# Patient Record
Sex: Male | Born: 1950
Health system: Southern US, Community
[De-identification: ages and names within clinical notes are randomized; demographics above are authoritative.]

## PROBLEM LIST (undated history)

## (undated) DIAGNOSIS — I10 Essential (primary) hypertension: Secondary | ICD-10-CM

## (undated) DIAGNOSIS — E119 Type 2 diabetes mellitus without complications: Secondary | ICD-10-CM

---

## 2019-02-27 ENCOUNTER — Inpatient Hospital Stay (HOSPITAL_COMMUNITY)
Admission: EM | Admit: 2019-02-27 | Discharge: 2019-03-03 | DRG: 177 | Disposition: A | Discharge status: Home / Self Care | Payer: Medicare PPO | Attending: Internal Medicine | Admitting: Internal Medicine

## 2019-02-27 ENCOUNTER — Telehealth: Payer: Self-pay | Admitting: Nurse Practitioner

## 2019-02-27 ENCOUNTER — Encounter (HOSPITAL_COMMUNITY): Payer: Self-pay | Admitting: Emergency Medicine

## 2019-02-27 ENCOUNTER — Emergency Department (HOSPITAL_COMMUNITY): Payer: Medicare PPO

## 2019-02-27 DIAGNOSIS — J1282 Pneumonia due to coronavirus disease 2019: Secondary | ICD-10-CM | POA: Diagnosis present

## 2019-02-27 DIAGNOSIS — U071 COVID-19: Secondary | ICD-10-CM | POA: Diagnosis present

## 2019-02-27 DIAGNOSIS — I10 Essential (primary) hypertension: Secondary | ICD-10-CM

## 2019-02-27 DIAGNOSIS — R0902 Hypoxemia: Secondary | ICD-10-CM

## 2019-02-27 DIAGNOSIS — Z79899 Other long term (current) drug therapy: Secondary | ICD-10-CM

## 2019-02-27 DIAGNOSIS — Z794 Long term (current) use of insulin: Secondary | ICD-10-CM | POA: Diagnosis not present

## 2019-02-27 DIAGNOSIS — J9601 Acute respiratory failure with hypoxia: Secondary | ICD-10-CM | POA: Diagnosis present

## 2019-02-27 DIAGNOSIS — I82452 Acute embolism and thrombosis of left peroneal vein: Secondary | ICD-10-CM | POA: Diagnosis present

## 2019-02-27 DIAGNOSIS — R791 Abnormal coagulation profile: Secondary | ICD-10-CM | POA: Diagnosis not present

## 2019-02-27 DIAGNOSIS — R7989 Other specified abnormal findings of blood chemistry: Secondary | ICD-10-CM | POA: Diagnosis present

## 2019-02-27 DIAGNOSIS — J189 Pneumonia, unspecified organism: Secondary | ICD-10-CM

## 2019-02-27 DIAGNOSIS — E119 Type 2 diabetes mellitus without complications: Secondary | ICD-10-CM | POA: Diagnosis present

## 2019-02-27 HISTORY — DX: Type 2 diabetes mellitus without complications: E11.9

## 2019-02-27 HISTORY — DX: Essential (primary) hypertension: I10

## 2019-02-27 LAB — CBC WITH DIFFERENTIAL/PLATELET
Abs Immature Granulocytes: 0.09 10*3/uL — ABNORMAL HIGH (ref 0.00–0.07)
Basophils Absolute: 0 10*3/uL (ref 0.0–0.1)
Basophils Relative: 0 %
Eosinophils Absolute: 0 10*3/uL (ref 0.0–0.5)
Eosinophils Relative: 0 %
HCT: 44.8 % (ref 39.0–52.0)
Hemoglobin: 14.7 g/dL (ref 13.0–17.0)
Immature Granulocytes: 1 %
Lymphocytes Relative: 15 %
Lymphs Abs: 1.3 10*3/uL (ref 0.7–4.0)
MCH: 30.8 pg (ref 26.0–34.0)
MCHC: 32.8 g/dL (ref 30.0–36.0)
MCV: 93.7 fL (ref 80.0–100.0)
Monocytes Absolute: 0.5 10*3/uL (ref 0.1–1.0)
Monocytes Relative: 5 %
Neutro Abs: 6.7 10*3/uL (ref 1.7–7.7)
Neutrophils Relative %: 79 %
Platelets: 300 10*3/uL (ref 150–400)
RBC: 4.78 MIL/uL (ref 4.22–5.81)
RDW: 12.5 % (ref 11.5–15.5)
WBC: 8.6 10*3/uL (ref 4.0–10.5)
nRBC: 0.2 % (ref 0.0–0.2)

## 2019-02-27 LAB — HEMOGLOBIN A1C
Hgb A1c MFr Bld: 8.3 % — ABNORMAL HIGH (ref 4.8–5.6)
Mean Plasma Glucose: 191.51 mg/dL

## 2019-02-27 LAB — C-REACTIVE PROTEIN: CRP: 20.5 mg/dL — ABNORMAL HIGH (ref <1.0)

## 2019-02-27 LAB — COMPREHENSIVE METABOLIC PANEL
ALT: 81 U/L — ABNORMAL HIGH (ref 0–44)
AST: 80 U/L — ABNORMAL HIGH (ref 15–41)
Albumin: 2.5 g/dL — ABNORMAL LOW (ref 3.5–5.0)
Alkaline Phosphatase: 82 U/L (ref 38–126)
Anion gap: 12 (ref 5–15)
BUN: 16 mg/dL (ref 8–23)
CO2: 23 mmol/L (ref 22–32)
Calcium: 8.5 mg/dL — ABNORMAL LOW (ref 8.9–10.3)
Chloride: 102 mmol/L (ref 98–111)
Creatinine, Ser: 1.01 mg/dL (ref 0.61–1.24)
GFR calc Af Amer: >60 mL/min (ref >60)
GFR calc non Af Amer: >60 mL/min (ref >60)
Glucose, Bld: 196 mg/dL — ABNORMAL HIGH (ref 70–99)
Potassium: 3.6 mmol/L (ref 3.5–5.1)
Sodium: 137 mmol/L (ref 135–145)
Total Bilirubin: 0.4 mg/dL (ref 0.3–1.2)
Total Protein: 6.9 g/dL (ref 6.5–8.1)

## 2019-02-27 LAB — TRIGLYCERIDES: Triglycerides: 104 mg/dL (ref <150)

## 2019-02-27 LAB — D-DIMER, QUANTITATIVE: D-Dimer, Quant: >20 ug/mL-FEU — ABNORMAL HIGH (ref 0.00–0.50)

## 2019-02-27 LAB — POC SARS CORONAVIRUS 2 AG -  ED: SARS Coronavirus 2 Ag: NEGATIVE

## 2019-02-27 LAB — LACTIC ACID, PLASMA: Lactic Acid, Venous: 1.9 mmol/L (ref 0.5–1.9)

## 2019-02-27 LAB — FERRITIN: Ferritin: 826 ng/mL — ABNORMAL HIGH (ref 24–336)

## 2019-02-27 LAB — LACTATE DEHYDROGENASE: LDH: 645 U/L — ABNORMAL HIGH (ref 98–192)

## 2019-02-27 LAB — ABO/RH: ABO/RH(D): O NEG

## 2019-02-27 LAB — FIBRINOGEN: Fibrinogen: >800 mg/dL — ABNORMAL HIGH (ref 210–475)

## 2019-02-27 LAB — PROCALCITONIN: Procalcitonin: 0.41 ng/mL

## 2019-02-27 MED ORDER — HYDROCOD POLST-CPM POLST ER 10-8 MG/5ML PO SUER: 5.0000 mL | Freq: Two times a day (BID) | ORAL | Status: DC | PRN

## 2019-02-27 MED ORDER — SODIUM CHLORIDE 0.9 % IV SOLN
500.0000 mg | INTRAVENOUS | Status: AC
  Administered 2019-02-28 (×2): 500 mg via INTRAVENOUS
  Filled 2019-02-27 (×2): qty 500

## 2019-02-27 MED ORDER — ENOXAPARIN SODIUM 40 MG/0.4ML ~~LOC~~ SOLN: 40.0000 mg | SUBCUTANEOUS | Status: DC

## 2019-02-27 MED ORDER — HYDRALAZINE HCL 20 MG/ML IJ SOLN: 5.0000 mg | INTRAMUSCULAR | Status: DC | PRN

## 2019-02-27 MED ORDER — ALBUTEROL SULFATE HFA 108 (90 BASE) MCG/ACT IN AERS
2.0000 | INHALATION_SPRAY | Freq: Four times a day (QID) | RESPIRATORY_TRACT | Status: DC | PRN
  Filled 2019-02-27: qty 6.7

## 2019-02-27 MED ORDER — ACETAMINOPHEN 325 MG PO TABS: 650.0000 mg | ORAL_TABLET | Freq: Four times a day (QID) | ORAL | Status: DC | PRN

## 2019-02-27 MED ORDER — SODIUM CHLORIDE 0.9 % IV SOLN
1.0000 g | INTRAVENOUS | Status: AC
  Administered 2019-02-27 – 2019-02-28 (×2): 1 g via INTRAVENOUS
  Filled 2019-02-27 (×2): qty 10

## 2019-02-27 MED ORDER — ATORVASTATIN CALCIUM 10 MG PO TABS
20.0000 mg | ORAL_TABLET | Freq: Every day | ORAL | Status: DC
  Administered 2019-02-27 – 2019-03-02 (×4): 20 mg via ORAL
  Filled 2019-02-27 (×4): qty 2

## 2019-02-27 MED ORDER — ZINC SULFATE 220 (50 ZN) MG PO CAPS
220.0000 mg | ORAL_CAPSULE | Freq: Every day | ORAL | Status: DC
  Administered 2019-02-27 – 2019-03-03 (×5): 220 mg via ORAL
  Filled 2019-02-27 (×5): qty 1

## 2019-02-27 MED ORDER — INSULIN ASPART 100 UNIT/ML ~~LOC~~ SOLN
0.0000 [IU] | Freq: Three times a day (TID) | SUBCUTANEOUS | Status: DC
  Administered 2019-02-28: 08:00:00 1 [IU] via SUBCUTANEOUS
  Administered 2019-02-28 (×2): 2 [IU] via SUBCUTANEOUS
  Administered 2019-03-01: 09:00:00 3 [IU] via SUBCUTANEOUS
  Administered 2019-03-01 (×2): 5 [IU] via SUBCUTANEOUS
  Administered 2019-03-02: 09:00:00 2 [IU] via SUBCUTANEOUS
  Administered 2019-03-02: 13:00:00 7 [IU] via SUBCUTANEOUS
  Administered 2019-03-02: 19:00:00 3 [IU] via SUBCUTANEOUS
  Administered 2019-03-03: 13:00:00 2 [IU] via SUBCUTANEOUS
  Administered 2019-03-03: 3 [IU] via SUBCUTANEOUS

## 2019-02-27 MED ORDER — ASCORBIC ACID 500 MG PO TABS
500.0000 mg | ORAL_TABLET | Freq: Every day | ORAL | Status: DC
  Administered 2019-02-27 – 2019-03-03 (×5): 500 mg via ORAL
  Filled 2019-02-27 (×5): qty 1

## 2019-02-27 MED ORDER — DEXAMETHASONE SODIUM PHOSPHATE 10 MG/ML IJ SOLN
6.0000 mg | INTRAMUSCULAR | Status: AC
  Administered 2019-02-27 – 2019-03-02 (×4): 6 mg via INTRAVENOUS
  Filled 2019-02-27 (×5): qty 1

## 2019-02-27 MED ORDER — INSULIN GLARGINE 100 UNIT/ML ~~LOC~~ SOLN
12.0000 [IU] | Freq: Every day | SUBCUTANEOUS | Status: DC
  Administered 2019-02-27 – 2019-02-28 (×2): 12 [IU] via SUBCUTANEOUS
  Filled 2019-02-27 (×3): qty 0.12

## 2019-02-27 MED ORDER — DEXAMETHASONE SODIUM PHOSPHATE 10 MG/ML IJ SOLN: 10.0000 mg | Freq: Once | INTRAMUSCULAR | Status: DC

## 2019-02-27 MED ORDER — ALBUTEROL SULFATE HFA 108 (90 BASE) MCG/ACT IN AERS: 2.0000 | INHALATION_SPRAY | Freq: Once | RESPIRATORY_TRACT | Status: DC

## 2019-02-27 MED ORDER — GUAIFENESIN-DM 100-10 MG/5ML PO SYRP: 10.0000 mL | ORAL_SOLUTION | ORAL | Status: DC | PRN

## 2019-02-27 NOTE — ED Notes (Signed)
The patient now states that he was swabbed for Covid-19 on 12/25/2019 from [facility] ordered by Dr.Griffen. His results were positive.

## 2019-02-27 NOTE — ED Notes (Signed)
Patient son Johanny calling asking for a call back, just needs an update on patient   [phone]

## 2019-02-27 NOTE — H&P (Addendum)
History and Physical    Randy Baker ZOX:[id number] DOB: [birth year] DOA: 02/27/2019  PCP: Lavone Orn, MD Patient coming from: Home  Chief Complaint: Low oxygen saturation  HPI: Randy Baker is a 68 y.o. male with medical history significant of insulin-dependent diabetes mellitus, hypertension presenting to the ED via EMS for evaluation of low oxygen saturation.  Patient reports testing positive for COVID-19 last week.  States his son is a physician and advised him to check his oxygen saturation at home regularly.  Today his oxygen saturation would not come up above 85%.  He has not had any shortness of breath or cough.  Denies chest pain.  Reports loss of sense of taste and smell.  Denies nausea, vomiting, abdominal pain, or diarrhea.  Denies history of blood clots.  Denies noticing any calf pain, swelling, or erythema.  ED Course: Afebrile.  Oxygen saturation as low as 78% on room air in the ED, improved to low 90s with 6 L supplemental oxygen.  SARS-CoV-2 rapid antigen test negative, PCR test pending.  Labs showing no leukocytosis.  Lactic acid normal.  Mild transaminitis (AST 80, ALT 81).  Alk phos and T bili normal.  Blood culture x2 pending.  Procalcitonin 0.41.  Inflammatory markers significantly elevated: D-dimer >20, LDH 645, ferritin 826, fibrinogen >800, and CRP 20.5.  Chest x-ray personally reviewed showing patchy multifocal areas of mixed interstitial and airspace opacities throughout both lungs, most notably in the right upper lobe and left lung periphery.  Review of Systems:  All systems reviewed and apart from history of presenting illness, are negative.  Past Medical History:  Diagnosis Date  . Diabetes mellitus without complication ([place])   . Hypertension     History reviewed. No pertinent surgical history.   reports that he has never smoked. He has never used smokeless tobacco. He reports previous alcohol use. He reports previous drug use.  No Known  Allergies  History reviewed. No pertinent family history.  Prior to Admission medications   Medication Sig Start Date End Date Taking? Authorizing Provider  amLODipine-valsartan (EXFORGE) 5-160 MG tablet Take 1 tablet by mouth daily.   Yes [provider]  atorvastatin (LIPITOR) 20 MG tablet Take 20 mg by mouth daily.   Yes [provider]  Empagliflozin-metFORMIN HCl (SYNJARDY) 12.06-998 MG TABS Take 1 tablet by mouth 2 (two) times daily.   Yes [provider]  Insulin Glargine, 2 Unit Dial, (TOUJEO MAX SOLOSTAR) 300 UNIT/ML SOPN Inject 12 Units into the skin daily.   Yes [provider]    Physical Exam: Vitals:   02/27/19 2100 02/27/19 2130 02/27/19 2145 02/27/19 2200  BP: (!) 114/92 (!) 134/107  (!) 157/112  Pulse: 80 80 79 74  Resp: (!) 24 20 (!) 27 (!) 35  Temp:      TempSrc:      SpO2: 93% 90% 90% (!) 85%    Physical Exam  Constitutional: He is oriented to person, place, and time. He appears well-developed and well-nourished.  HENT:  Head: Normocephalic.  Eyes: Right eye exhibits no discharge. Left eye exhibits no discharge.  Cardiovascular: Normal rate, regular rhythm and intact distal pulses.  Pulmonary/Chest: Breath sounds normal. He is in respiratory distress. He has no wheezes. He has no rales.  Oxygen saturation in the low 90s on 6 L supplemental oxygen Tachypneic with respiratory rate up to 30s  Abdominal: Soft. Bowel sounds are normal. He exhibits no distension. There is no abdominal tenderness. There is no guarding.  Musculoskeletal:        General: No edema.     Cervical back: Neck supple.     Comments: No erythema, tenderness, or swelling of bilateral lower extremities  Neurological: He is alert and oriented to person, place, and time.  Skin: Skin is warm and dry. He is not diaphoretic.     Labs on Admission: I have personally reviewed following labs and imaging studies  CBC: Recent Labs  Lab 02/27/19 1737  WBC 8.6   NEUTROABS 6.7  HGB 14.7  HCT 44.8  MCV 93.7  PLT 425   Basic Metabolic Panel: Recent Labs  Lab 02/27/19 1737  NA 137  K 3.6  CL 102  CO2 23  GLUCOSE 196*  BUN 16  CREATININE 1.01  CALCIUM 8.5*   GFR: CrCl cannot be calculated (Unknown ideal weight.). Liver Function Tests: Recent Labs  Lab 02/27/19 1737  AST 80*  ALT 81*  ALKPHOS 82  BILITOT 0.4  PROT 6.9  ALBUMIN 2.5*   No results for input(s): LIPASE, AMYLASE in the last 168 hours. No results for input(s): AMMONIA in the last 168 hours. Coagulation Profile: No results for input(s): INR, PROTIME in the last 168 hours. Cardiac Enzymes: No results for input(s): CKTOTAL, CKMB, CKMBINDEX, TROPONINI in the last 168 hours. BNP (last 3 results) No results for input(s): PROBNP in the last 8760 hours. HbA1C: No results for input(s): HGBA1C in the last 72 hours. CBG: No results for input(s): GLUCAP in the last 168 hours. Lipid Profile: Recent Labs    02/27/19 1922  TRIG 104   Thyroid Function Tests: No results for input(s): TSH, T4TOTAL, FREET4, T3FREE, THYROIDAB in the last 72 hours. Anemia Panel: Recent Labs    02/27/19 1922  FERRITIN 826*   Urine analysis: No results found for: COLORURINE, APPEARANCEUR, LABSPEC, PHURINE, GLUCOSEU, HGBUR, BILIRUBINUR, KETONESUR, PROTEINUR, UROBILINOGEN, NITRITE, LEUKOCYTESUR  Radiological Exams on Admission: DG Chest 2 View  Result Date: 02/27/2019 CLINICAL DATA:  Low O2 saturation, positive COVID-19 test EXAM: CHEST - 2 VIEW COMPARISON:  None FINDINGS: Patchy multifocal areas of mixed interstitial and airspace opacity throughout both lungs most notably in the right upper lobe and left lung periphery. No pneumothorax or effusion. The cardiomediastinal contours are unremarkable. No acute osseous or soft tissue abnormality. Degenerative changes are present in the imaged spine and shoulders. IMPRESSION: Multifocal areas of opacity compatible with pneumonia in the setting of  COVID-19 positivity. Electronically Signed   By: Lovena Le M.D.   On: 02/27/2019 17:49    EKG: Independently reviewed.  Sinus rhythm, baseline wander in multiple leads.  No prior tracing for comparison.  Assessment/Plan Principal Problem:   Acute respiratory failure with hypoxia (HCC) Active Problems:   Multifocal pneumonia   Type 2 diabetes mellitus ([place])   Essential hypertension   Acute hypoxic respiratory failure secondary to suspected COVID-19 viral multifocal pneumonia/concern for possible PE Patient reports testing positive for COVID-19 last week, no records available at this time.  Currently afebrile.  Oxygen saturation as low as 78% on room air in the ED, improved to low 90s with 6 L supplemental oxygen.  Tachypneic with respiratory rate in the 30s.   Labs showing no leukocytosis.  Lactic acid normal.  Mild transaminitis (AST 80, ALT 81). Inflammatory markers significantly elevated: D-dimer >20, LDH 645, ferritin 826, fibrinogen >800, and CRP 20.5.  Chest x-ray personally reviewed showing patchy multifocal areas of mixed interstitial and airspace opacities throughout both lungs, most notably in the right upper lobe and  left lung periphery.  Although rapid SARS-CoV-2 antigen test negative, concern for COVID-19 remains very high.  In addition, there is concern for possible PE given significantly elevated D-dimer and risk of thromboembolism with COVID-19 infection. -IV Decadron 6 mg daily -SARS-CoV-2 PCR test pending.  If positive, start remdesivir. -Although suspicion for bacterial pneumonia low, procalcitonin is elevated at 0.41.  Will cover with antibiotics at this time and trend procalcitonin level daily.  Start ceftriaxone and azithromycin. -CT angiogram chest to rule out PE -Vitamin C, zinc -Antitussives as needed -Tylenol as needed -Inhaler as needed -Daily CBC with differential, CMP, CRP, D-dimer, LDH -Airborne and contact precautions -Continuous pulse ox -Supplemental  oxygen as needed to keep oxygen saturation above 90% -Blood culture x2 pending  Addendum: Stat CT angiogram was ordered several hours ago and is still not done.  Spoke to radiology and requested them to do the study as soon as possible.  Due to this delay, will go ahead and start heparin infusion empirically due to concern for possible PE.  Insulin-dependent diabetes mellitus -Check A1c.  Sliding scale insulin before meals and CBG checks. -Continue home insulin glargine 12 units daily  Hypertension Systolic currently in the 140s. -Hydralazine as needed for SBP greater than 160  DVT prophylaxis: Lovenox Code Status: Full code.  Discussed with the patient. Family Communication: No family at bedside. Disposition Plan: Anticipate discharge after clinical improvement. Consults called: None Admission status: It is my clinical opinion that admission to [facility] is reasonable and necessary in this 69 y.o. male . presenting with acute hypoxic respiratory failure secondary to suspected COVID-19 viral multifocal pneumonia.  Tachypneic and requiring 6 L supplemental oxygen.  There is also concern for possible PE.  Very high risk of decompensation.  Given the aforementioned, the predictability of an adverse outcome is felt to be significant. I expect that the patient will require at least 2 midnights in the hospital to treat this condition.   The medical decision making on this patient was of high complexity and the patient is at high risk for clinical deterioration, therefore this is a level 3 visit.  Shela Leff MD [facility] Pager [phone]  If 7PM-7AM, please contact night-coverage [email or web address] Password [email or web address]  02/27/2019, 10:38 PM

## 2019-02-27 NOTE — ED Triage Notes (Signed)
Patient presents to the ED by EMS with c/o with low O2 sats, he had covid test x5 days ago that was negative. With EMS sats 85% on room air then 94-95% on 5 L. Denies shortness of breath, cough or fever.

## 2019-02-27 NOTE — Care Management (Signed)
Ed CM received referral from EDP for  Covid+ remote health Program, patietnt is on 3L O2 sats. Contacted Ashlie at [facility] to arrange home oxygen. ED evaluation still in progress.  Laurena Slimmer RN, BSN  ED Care Manager [phone]

## 2019-02-27 NOTE — Care Management (Signed)
EDP has decided to admit patient O2 requirements have increase as per EDP. [facility] will follow for oxygen needs upon discharge.

## 2019-02-27 NOTE — Telephone Encounter (Signed)
Called to Discuss with patient about Covid symptoms and the use of bamlanivimab, a monoclonal antibody infusion for those with mild to moderate Covid symptoms and at a high risk of hospitalization.     Pt is qualified for this infusion at the [facility] infusion center due to co-morbid conditions and/or a member of an at-risk group.     Patient states that his SPO2 has dropped to 87% and he seemed slightly confused. I advised him to go to the ED and offered to call EMS for him. Patient refused and stated that he would get a family member to take him. I called and informed Dr. Delene Ruffini nurse about this. They stated that they would call back to check on him and advise him the same.

## 2019-02-27 NOTE — ED Provider Notes (Signed)
[facility] EMERGENCY DEPARTMENT Provider Note   CSN: [id number] Arrival date & time: 02/27/19  1637     History Chief Complaint  Patient presents with  . Covid Positive  . Shortness of Breath  . Low O2 Sats    Randy Baker is a 69 y.o. male hx of DM, HTN, here presenting with some shortness of breath, positive Covid.  Patient tested because of for Covid on January 9 .  He has progressive shortness of breath this week.  His son is a physician and got him a pulse oximeter.  He states that he was using it and his oxygen persistently dips below 90% so he came here for evaluation. Patient denies any fevers but just shortness of breath .  Patient was noted to be hypoxic 70s- 80s in the ED.   The history is provided by the patient.       Past Medical History:  Diagnosis Date  . Diabetes mellitus without complication ([place])   . Hypertension     There are no problems to display for this patient.   History reviewed. No pertinent surgical history.     No family history on file.  Social History   Tobacco Use  . Smoking status: Never Smoker  . Smokeless tobacco: Never Used  Substance Use Topics  . Alcohol use: Not Currently  . Drug use: Not Currently    Home Medications Prior to Admission medications   Not on File    Allergies    Patient has no known allergies.  Review of Systems   Review of Systems  Respiratory: Positive for shortness of breath.   All other systems reviewed and are negative.   Physical Exam Updated Vital Signs BP (!) 145/107   Pulse 78   Temp 98.4 F (36.9 C) (Oral)   Resp (!) 28   SpO2 100%   Physical Exam Vitals and nursing note reviewed.  HENT:     Head: Normocephalic.     Mouth/Throat:     Mouth: Mucous membranes are moist.  Eyes:     Pupils: Pupils are equal, round, and reactive to light.  Cardiovascular:     Rate and Rhythm: Normal rate and regular rhythm.  Pulmonary:     Comments: Tachypneic, mild  crackles bilaterally  Abdominal:     General: Bowel sounds are normal.     Palpations: Abdomen is soft.  Musculoskeletal:        General: Normal range of motion.     Cervical back: Normal range of motion.  Skin:    General: Skin is warm.     Capillary Refill: Capillary refill takes less than 2 seconds.  Neurological:     General: No focal deficit present.     Mental Status: He is alert and oriented to person, place, and time.  Psychiatric:        Mood and Affect: Mood normal.        Behavior: Behavior normal.     ED Results / Procedures / Treatments   Labs (all labs ordered are listed, but only abnormal results are displayed) Labs Reviewed  CBC WITH DIFFERENTIAL/PLATELET - Abnormal; Notable for the following components:      Result Value   Abs Immature Granulocytes 0.09 (*)    All other components within normal limits  COMPREHENSIVE METABOLIC PANEL - Abnormal; Notable for the following components:   Glucose, Bld 196 (*)    Calcium 8.5 (*)    Albumin 2.5 (*)  AST 80 (*)    ALT 81 (*)    All other components within normal limits  D-DIMER, QUANTITATIVE (NOT AT [facility]) - Abnormal; Notable for the following components:   D-Dimer, Quant >20.00 (*)    All other components within normal limits  LACTATE DEHYDROGENASE - Abnormal; Notable for the following components:   LDH 645 (*)    All other components within normal limits  FERRITIN - Abnormal; Notable for the following components:   Ferritin 826 (*)    All other components within normal limits  FIBRINOGEN - Abnormal; Notable for the following components:   Fibrinogen >800 (*)    All other components within normal limits  C-REACTIVE PROTEIN - Abnormal; Notable for the following components:   CRP 20.5 (*)    All other components within normal limits  CULTURE, BLOOD (ROUTINE X 2)  CULTURE, BLOOD (ROUTINE X 2)  SARS CORONAVIRUS 2 (TAT 6-24 HRS)  LACTIC ACID, PLASMA  TRIGLYCERIDES  LACTIC ACID, PLASMA  PROCALCITONIN  POC  SARS CORONAVIRUS 2 AG -  ED  POC SARS CORONAVIRUS 2 AG -  ED    EKG EKG Interpretation  Date/Time:  Tuesday February 27 2019 17:00:00 EST Ventricular Rate:  93 PR Interval:  146 QRS Duration: 78 QT Interval:  358 QTC Calculation: 445 R Axis:   24 Text Interpretation: Normal sinus rhythm Nonspecific ST and T wave abnormality Abnormal ECG No previous ECGs available Confirmed by Wandra Arthurs [phone]) on 02/27/2019 6:28:31 PM   Radiology DG Chest 2 View  Result Date: 02/27/2019 CLINICAL DATA:  Low O2 saturation, positive COVID-19 test EXAM: CHEST - 2 VIEW COMPARISON:  None FINDINGS: Patchy multifocal areas of mixed interstitial and airspace opacity throughout both lungs most notably in the right upper lobe and left lung periphery. No pneumothorax or effusion. The cardiomediastinal contours are unremarkable. No acute osseous or soft tissue abnormality. Degenerative changes are present in the imaged spine and shoulders. IMPRESSION: Multifocal areas of opacity compatible with pneumonia in the setting of COVID-19 positivity. Electronically Signed   By: Lovena Le M.D.   On: 02/27/2019 17:49    Procedures Procedures (including critical care time)  CRITICAL CARE Performed by: Wandra Arthurs   Total critical care time: 30 minutes  Critical care time was exclusive of separately billable procedures and treating other patients.  Critical care was necessary to treat or prevent imminent or life-threatening deterioration.  Critical care was time spent personally by me on the following activities: development of treatment plan with patient and/or surrogate as well as nursing, discussions with consultants, evaluation of patient's response to treatment, examination of patient, obtaining history from patient or surrogate, ordering and performing treatments and interventions, ordering and review of laboratory studies, ordering and review of radiographic studies, pulse oximetry and re-evaluation of  patient's condition.   Medications Ordered in ED Medications  albuterol (VENTOLIN HFA) 108 (90 Base) MCG/ACT inhaler 2 puff (has no administration in time range)    ED Course  I have reviewed the triage vital signs and the nursing notes.  Pertinent labs & imaging results that were available during my care of the patient were reviewed by me and considered in my medical decision making (see chart for details).    MDM Rules/Calculators/A&P                      Randy Baker is a 69 y.o. male here with positive Covid on 1/9.  He is noted to be hypoxic.  He is 92% on 6 L nasal cannula .  Patient is slightly tachypneic.  Will get Covid preadmission labs .  I talked to the case manager about sending patient home with oxygen .  However he requires 6 L and this is too much to be sent home with so will admit for hypoxia secondary to Covid.  8:42 PM  Inflammatory markers elevated. Rapid COVID antigen negative, will send 6-24 hr COVID test. Will admit.   Final Clinical Impression(s) / ED Diagnoses Final diagnoses:  None    Rx / DC Orders ED Discharge Orders    None       Drenda Freeze, MD 02/27/19 2043

## 2019-02-28 ENCOUNTER — Other Ambulatory Visit: Payer: Self-pay

## 2019-02-28 ENCOUNTER — Inpatient Hospital Stay (HOSPITAL_COMMUNITY): Payer: Medicare PPO

## 2019-02-28 DIAGNOSIS — R0902 Hypoxemia: Secondary | ICD-10-CM

## 2019-02-28 DIAGNOSIS — I10 Essential (primary) hypertension: Secondary | ICD-10-CM

## 2019-02-28 DIAGNOSIS — J1282 Pneumonia due to coronavirus disease 2019: Secondary | ICD-10-CM | POA: Diagnosis present

## 2019-02-28 LAB — CBC WITH DIFFERENTIAL/PLATELET
Abs Immature Granulocytes: 0.06 10*3/uL (ref 0.00–0.07)
Basophils Absolute: 0 10*3/uL (ref 0.0–0.1)
Basophils Relative: 0 %
Eosinophils Absolute: 0 10*3/uL (ref 0.0–0.5)
Eosinophils Relative: 0 %
HCT: 45.9 % (ref 39.0–52.0)
Hemoglobin: 14.8 g/dL (ref 13.0–17.0)
Immature Granulocytes: 1 %
Lymphocytes Relative: 14 %
Lymphs Abs: 1.1 10*3/uL (ref 0.7–4.0)
MCH: 30.6 pg (ref 26.0–34.0)
MCHC: 32.2 g/dL (ref 30.0–36.0)
MCV: 94.8 fL (ref 80.0–100.0)
Monocytes Absolute: 0.2 10*3/uL (ref 0.1–1.0)
Monocytes Relative: 3 %
Neutro Abs: 6 10*3/uL (ref 1.7–7.7)
Neutrophils Relative %: 82 %
Platelets: 299 10*3/uL (ref 150–400)
RBC: 4.84 MIL/uL (ref 4.22–5.81)
RDW: 12.7 % (ref 11.5–15.5)
WBC: 7.4 10*3/uL (ref 4.0–10.5)
nRBC: 0.3 % — ABNORMAL HIGH (ref 0.0–0.2)

## 2019-02-28 LAB — COMPREHENSIVE METABOLIC PANEL
ALT: 68 U/L — ABNORMAL HIGH (ref 0–44)
AST: 66 U/L — ABNORMAL HIGH (ref 15–41)
Albumin: 2.4 g/dL — ABNORMAL LOW (ref 3.5–5.0)
Alkaline Phosphatase: 80 U/L (ref 38–126)
Anion gap: 10 (ref 5–15)
BUN: 18 mg/dL (ref 8–23)
CO2: 25 mmol/L (ref 22–32)
Calcium: 8.2 mg/dL — ABNORMAL LOW (ref 8.9–10.3)
Chloride: 104 mmol/L (ref 98–111)
Creatinine, Ser: 0.92 mg/dL (ref 0.61–1.24)
GFR calc Af Amer: >60 mL/min (ref >60)
GFR calc non Af Amer: >60 mL/min (ref >60)
Glucose, Bld: 148 mg/dL — ABNORMAL HIGH (ref 70–99)
Potassium: 4.9 mmol/L (ref 3.5–5.1)
Sodium: 139 mmol/L (ref 135–145)
Total Bilirubin: 1.2 mg/dL (ref 0.3–1.2)
Total Protein: 6.7 g/dL (ref 6.5–8.1)

## 2019-02-28 LAB — GLUCOSE, CAPILLARY
Glucose-Capillary: 176 mg/dL — ABNORMAL HIGH (ref 70–99)
Glucose-Capillary: 277 mg/dL — ABNORMAL HIGH (ref 70–99)

## 2019-02-28 LAB — CBG MONITORING, ED
Glucose-Capillary: 154 mg/dL — ABNORMAL HIGH (ref 70–99)
Glucose-Capillary: 171 mg/dL — ABNORMAL HIGH (ref 70–99)
Glucose-Capillary: 134 mg/dL — ABNORMAL HIGH (ref 70–99)
Glucose-Capillary: 153 mg/dL — ABNORMAL HIGH (ref 70–99)

## 2019-02-28 LAB — C-REACTIVE PROTEIN: CRP: 20.5 mg/dL — ABNORMAL HIGH (ref <1.0)

## 2019-02-28 LAB — LACTATE DEHYDROGENASE: LDH: 727 U/L — ABNORMAL HIGH (ref 98–192)

## 2019-02-28 LAB — D-DIMER, QUANTITATIVE: D-Dimer, Quant: >20 ug/mL-FEU — ABNORMAL HIGH (ref 0.00–0.50)

## 2019-02-28 LAB — SARS CORONAVIRUS 2 (TAT 6-24 HRS): SARS Coronavirus 2: POSITIVE — AB

## 2019-02-28 LAB — PROCALCITONIN: Procalcitonin: 0.19 ng/mL

## 2019-02-28 LAB — HIV ANTIBODY (ROUTINE TESTING W REFLEX): HIV Screen 4th Generation wRfx: NONREACTIVE

## 2019-02-28 MED ORDER — AMLODIPINE BESYLATE-VALSARTAN 5-160 MG PO TABS: 1.0000 | ORAL_TABLET | Freq: Every day | ORAL | Status: DC

## 2019-02-28 MED ORDER — AMLODIPINE BESYLATE 5 MG PO TABS
5.0000 mg | ORAL_TABLET | Freq: Every day | ORAL | Status: DC
  Administered 2019-02-28 – 2019-03-03 (×4): 5 mg via ORAL
  Filled 2019-02-28 (×4): qty 1

## 2019-02-28 MED ORDER — SODIUM CHLORIDE 0.9 % IV SOLN
100.0000 mg | Freq: Every day | INTRAVENOUS | Status: AC
  Administered 2019-03-01 – 2019-03-03 (×4): 100 mg via INTRAVENOUS
  Filled 2019-02-28 (×4): qty 20

## 2019-02-28 MED ORDER — HEPARIN BOLUS VIA INFUSION
5000.0000 [IU] | Freq: Once | INTRAVENOUS | Status: AC
  Administered 2019-02-28: 06:00:00 5000 [IU] via INTRAVENOUS
  Filled 2019-02-28: qty 5000

## 2019-02-28 MED ORDER — HEPARIN (PORCINE) 25000 UT/250ML-% IV SOLN
1700.0000 [IU]/h | INTRAVENOUS | Status: DC
  Administered 2019-02-28: 05:00:00 1700 [IU]/h via INTRAVENOUS
  Filled 2019-02-28 (×2): qty 250

## 2019-02-28 MED ORDER — ENOXAPARIN SODIUM 60 MG/0.6ML ~~LOC~~ SOLN
0.5000 mg/kg | Freq: Two times a day (BID) | SUBCUTANEOUS | Status: DC
  Administered 2019-02-28 – 2019-03-01 (×2): 50 mg via SUBCUTANEOUS
  Filled 2019-02-28 (×2): qty 0.6

## 2019-02-28 MED ORDER — IRBESARTAN 150 MG PO TABS
150.0000 mg | ORAL_TABLET | Freq: Every day | ORAL | Status: DC
  Administered 2019-02-28 – 2019-03-03 (×4): 150 mg via ORAL
  Filled 2019-02-28 (×4): qty 1

## 2019-02-28 MED ORDER — IOHEXOL 350 MG/ML SOLN
75.0000 mL | Freq: Once | INTRAVENOUS | Status: AC | PRN
  Administered 2019-02-28: 75 mL via INTRAVENOUS

## 2019-02-28 MED ORDER — LINAGLIPTIN 5 MG PO TABS
5.0000 mg | ORAL_TABLET | Freq: Every day | ORAL | Status: DC
  Administered 2019-02-28 – 2019-03-03 (×4): 5 mg via ORAL
  Filled 2019-02-28 (×4): qty 1

## 2019-02-28 MED ORDER — SODIUM CHLORIDE 0.9 % IV SOLN
200.0000 mg | Freq: Once | INTRAVENOUS | Status: AC
  Administered 2019-02-28: 19:00:00 200 mg via INTRAVENOUS
  Filled 2019-02-28 (×2): qty 40

## 2019-02-28 NOTE — ED Notes (Signed)
Pt is NSR w/ST depression on monitor ?

## 2019-02-28 NOTE — ED Notes (Signed)
Attempted to call [facility] for report

## 2019-02-28 NOTE — ED Notes (Signed)
Pt. Stats between 89-91.It falls mainly when asleep. Repositioned and atempted to get better stat.

## 2019-02-28 NOTE — Plan of Care (Signed)
  Problem: Education: Goal: Knowledge of risk factors and measures for prevention of condition will improve Outcome: Progressing   Problem: Respiratory: Goal: Will maintain a patent airway Outcome: Progressing   Problem: Education: Goal: Knowledge of General Education information will improve Description: Including pain rating scale, medication(s)/side effects and non-pharmacologic comfort measures Outcome: Progressing   Problem: Clinical Measurements: Goal: Will remain free from infection Outcome: Progressing   Problem: Clinical Measurements: Goal: Respiratory complications will improve Outcome: Progressing

## 2019-02-28 NOTE — ED Notes (Signed)
Back from CT

## 2019-02-28 NOTE — ED Notes (Signed)
Pt was at 88%, repositioned and moved oxygen to 6 L. Pt. Is standing between 90-92. Pt. Is aleert and oriented and does not fell labored in breathing but states he is ok

## 2019-02-28 NOTE — ED Notes (Signed)
Breakfast ordered 

## 2019-02-28 NOTE — Progress Notes (Signed)
PROGRESS NOTE    Randy Baker  [id number] DOB: May 16, 1950 DOA: 02/27/2019 PCP: Lavone Orn, MD   Brief Narrative:  69 year old gentleman prior history of insulin-dependent diabetes mellitus, hypertension was brought into ED for evaluation of low oxygen sats on pulse oximetry at home.  As per the patient he was tested positive for COVID-19 last week and was using his pulse oximetry at home and was found to have sats in the low 80s.  He was brought to ED for further evaluation of his hypoxia.  On arrival to ED his Covid PCR test was positive and CT angiogram showed bilateral opacities consistent with COVID-19 illness and no pulmonary embolus. He is currently requiring up to 5 L of nasal cannula oxygen to keep sats greater than 90% and was transferred to [facility] for further management.  Assessment & Plan:   Principal Problem:   Acute respiratory failure with hypoxia (HCC) Active Problems:   Multifocal pneumonia   Type 2 diabetes mellitus (HCC)   Essential hypertension   Pneumonia due to COVID-19 virus   Acute respiratory failure with hypoxia secondary to COVID-19 viral infection/pneumonia. Currently requiring up to 5 L of nasal cannula oxygen to keep sats around 90%.  Continue to follow inflammatory markers. CT angiogram of the chest reviewed showing patchy multifocal areas of airspace opacities, no pulmonary embolism.  He was started on IV Decadron and remdesivir. Follow blood cultures.    Essential hypertension Blood pressure parameters suboptimally controlled Start the patient on hydralazine 25 mg every 8 hours as needed for blood pressure greater than 170/90.    Insulin-dependent diabetes mellitus Continue with sliding scale insulin and resume home Lantus 12 units daily.       DVT prophylaxis: Lovenox Code Status full code Family Communication: Discussed with his son on the phone Disposition Plan: Pending improvement in hypoxia  Consultants:   None.    Procedures: CT angiogram of  The chest.  Antimicrobials: rocephin and zithromax started on admission.   Subjective: On 5 LIT of nasal cannula oxygen.  He denies any chest pain or shortness of breath.  Reports feeling slightly better after the oxygen was put on.  No nausea, vomiting or abdominal pain or no diarrhea.  Patient requested to call his son and update him.  Objective: Vitals:   02/28/19 1030 02/28/19 1130 02/28/19 1230 02/28/19 1311  BP: (!) 143/93 140/86 (!) 162/91 (!) 144/97  Pulse: 95 85 87 82  Resp: (!) 35 (!) 26 (!) 27 (!) 37  Temp:      TempSrc:      SpO2: 90% 91% 90% 91%  Weight:      Height:        Intake/Output Summary (Last 24 hours) at 02/28/2019 1328 Last data filed at 02/28/2019 1207 Gross per 24 hour  Intake 114.52 ml  Output 350 ml  Net -235.48 ml   Filed Weights   02/28/19 0104  Weight: 96.6 kg    Examination:  General exam: Appears calm and comfortable  Respiratory system: Clear to auscultation. Respiratory effort normal. Cardiovascular system: S1 & S2 heard, RRR. No JVD, murmurs, rubs, gallops or clicks. No pedal edema. Gastrointestinal system: Abdomen is nondistended, soft and nontender. No organomegaly or masses felt. Normal bowel sounds heard. Central nervous system: Alert and oriented. No focal neurological deficits. Extremities: Symmetric 5 x 5 power. Skin: No rashes, lesions or ulcers Psychiatry: Judgement and insight appear normal. Mood & affect appropriate.     Data Reviewed: I have personally reviewed  following labs and imaging studies  CBC: Recent Labs  Lab 02/27/19 1737 02/28/19 0534  WBC 8.6 7.4  NEUTROABS 6.7 6.0  HGB 14.7 14.8  HCT 44.8 45.9  MCV 93.7 94.8  PLT 300 [id number]   Basic Metabolic Panel: Recent Labs  Lab 02/27/19 1737 02/28/19 0534  NA 137 139  K 3.6 4.9  CL 102 104  CO2 23 25  GLUCOSE 196* 148*  BUN 16 18  CREATININE 1.01 0.92  CALCIUM 8.5* 8.2*   GFR: Estimated Creatinine Clearance: 92.4 mL/min  (by C-G formula based on SCr of 0.92 mg/dL). Liver Function Tests: Recent Labs  Lab 02/27/19 1737 02/28/19 0534  AST 80* 66*  ALT 81* 68*  ALKPHOS 82 80  BILITOT 0.4 1.2  PROT 6.9 6.7  ALBUMIN 2.5* 2.4*   No results for input(s): LIPASE, AMYLASE in the last 168 hours. No results for input(s): AMMONIA in the last 168 hours. Coagulation Profile: No results for input(s): INR, PROTIME in the last 168 hours. Cardiac Enzymes: No results for input(s): CKTOTAL, CKMB, CKMBINDEX, TROPONINI in the last 168 hours. BNP (last 3 results) No results for input(s): PROBNP in the last 8760 hours. HbA1C: Recent Labs    02/27/19 2231  HGBA1C 8.3*   CBG: Recent Labs  Lab 02/28/19 0221 02/28/19 0405 02/28/19 0756 02/28/19 1309  GLUCAP 154* 171* 134* 153*   Lipid Profile: Recent Labs    02/27/19 1922  TRIG 104   Thyroid Function Tests: No results for input(s): TSH, T4TOTAL, FREET4, T3FREE, THYROIDAB in the last 72 hours. Anemia Panel: Recent Labs    02/27/19 1922  FERRITIN 826*   Sepsis Labs: Recent Labs  Lab 02/27/19 1922 02/28/19 0534  PROCALCITON 0.41 0.19  LATICACIDVEN 1.9  --     Recent Results (from the past 240 hour(s))  Blood Culture (routine x 2)     Status: None (Preliminary result)   Collection Time: 02/27/19  7:15 PM   Specimen: BLOOD  Result Value Ref Range Status   Specimen Description BLOOD RIGHT ANTECUBITAL  Final   Special Requests   Final    BOTTLES DRAWN AEROBIC AND ANAEROBIC Blood Culture adequate volume   Culture   Final    NO GROWTH < 24 HOURS Performed at [facility], [place] [address], [place], [place] [address]    Report Status PENDING  Incomplete  Blood Culture (routine x 2)     Status: None (Preliminary result)   Collection Time: 02/27/19  7:22 PM   Specimen: BLOOD  Result Value Ref Range Status   Specimen Description BLOOD LEFT ANTECUBITAL  Final   Special Requests   Final    BOTTLES DRAWN AEROBIC AND ANAEROBIC Blood Culture  adequate volume   Culture   Final    NO GROWTH < 24 HOURS Performed at [facility], [place] [address] [place], [place] [address]    Report Status PENDING  Incomplete  SARS CORONAVIRUS 2 (TAT 6-24 HRS) Nasopharyngeal Nasopharyngeal Swab     Status: Abnormal   Collection Time: 02/27/19 10:31 PM   Specimen: Nasopharyngeal Swab  Result Value Ref Range Status   SARS Coronavirus 2 POSITIVE (A) NEGATIVE Final    Comment: RESULT CALLED TO, READ BACK BY AND VERIFIED WITH: RN  JOE, B. AT 0512 BY MESSAN H. ON 02/28/2019 (NOTE) SARS-CoV-2 target nucleic acids are DETECTED. The SARS-CoV-2 RNA is generally detectable in upper and lower respiratory specimens during the acute phase of infection. Positive results are indicative of the  presence of SARS-CoV-2 RNA. Clinical correlation with patient history and other diagnostic information is  necessary to determine patient infection status. Positive results do not rule out bacterial infection or co-infection with other viruses.  The expected result is Negative. Fact Sheet for Patients: SugarRoll.be Fact Sheet for Healthcare Providers: https://www.woods-mathews.com/ This test is not yet approved or cleared by the [place] FDA and  has been authorized for detection and/or diagnosis of SARS-CoV-2 by FDA under an Emergency Use Authorization (EUA). This EUA will remain  in effect (meaning this test can be use d) for the duration of the COVID-19 declaration under Section 564(b)(1) of the Act, 21 U.S.C. section 360bbb-3(b)(1), unless the authorization is terminated or revoked sooner. Performed at [facility], [place] [address], [employer], [place] [address]          Radiology Studies: DG Chest 2 View  Result Date: 02/27/2019 CLINICAL DATA:  Low O2 saturation, positive COVID-19 test EXAM: CHEST - 2 VIEW COMPARISON:  None FINDINGS: Patchy multifocal areas of mixed interstitial and airspace opacity  throughout both lungs most notably in the right upper lobe and left lung periphery. No pneumothorax or effusion. The cardiomediastinal contours are unremarkable. No acute osseous or soft tissue abnormality. Degenerative changes are present in the imaged spine and shoulders. IMPRESSION: Multifocal areas of opacity compatible with pneumonia in the setting of COVID-19 positivity. Electronically Signed   By: Lovena Le M.D.   On: 02/27/2019 17:49   CT ANGIO CHEST PE W OR WO CONTRAST  Result Date: 02/28/2019 CLINICAL DATA:  Hypoxemia EXAM: CT ANGIOGRAPHY CHEST WITH CONTRAST TECHNIQUE: Multidetector CT imaging of the chest was performed using the standard protocol during bolus administration of intravenous contrast. Multiplanar CT image reconstructions and MIPs were obtained to evaluate the vascular anatomy. CONTRAST:  57mL OMNIPAQUE IOHEXOL 350 MG/ML SOLN COMPARISON:  Radiograph same day FINDINGS: Cardiovascular: There is a optimal opacification of the pulmonary arteries. There is no central,segmental, or subsegmental filling defects within the pulmonary arteries. The heart is normal in size. No pericardial effusion or thickening. No evidence right heart strain. There is normal three-vessel brachiocephalic anatomy without proximal stenosis. Scattered aortic atherosclerosis. Mediastinum/Nodes: No hilar, mediastinal, or axillary adenopathy. Thyroid gland, trachea, and esophagus demonstrate no significant findings. Lungs/Pleura: Multifocal patchy ground-glass hazy airspace consolidation are seen throughout both lungs. No pneumothorax or pleural effusion. Upper Abdomen: No acute abnormalities present in the visualized portions of the upper abdomen. Musculoskeletal: No chest wall abnormality. No acute or significant osseous findings. Review of the MIP images confirms the above findings. IMPRESSION: No central, segmental, or subsegmental pulmonary embolism. Extensive multifocal airspace opacities throughout both lungs,  consistent with COVID pneumonia. Electronically Signed   By: Prudencio Pair M.D.   On: 02/28/2019 01:16        Scheduled Meds: . vitamin C  500 mg Oral Daily  . atorvastatin  20 mg Oral QHS  . dexamethasone (DECADRON) injection  6 mg Intravenous Q24H  . insulin aspart  0-9 Units Subcutaneous TID WC  . insulin glargine  12 Units Subcutaneous QHS  . zinc sulfate  220 mg Oral Daily   Continuous Infusions: . azithromycin Stopped (02/28/19 0425)  . cefTRIAXone (ROCEPHIN)  IV Stopped (02/27/19 2357)  . remdesivir 200 mg in sodium chloride 0.9% 250 mL IVPB     Followed by  . [START ON 03/01/2019] remdesivir 100 mg in NS 100 mL       LOS: 1 day        Hosie Poisson,  MD [facility]  02/28/2019, 1:28 PM

## 2019-02-28 NOTE — Progress Notes (Addendum)
Randy Baker  [id number] DOB: 1951/02/08 DOA: 02/27/2019 PCP: Lavone Orn, MD    Brief Narrative:  69 year old with a history of DM and HTN who presented to the ED via EMS with severe shortness of breath.  He reported testing positive for Covid the preceding week, and to discover that his saturations were 85% on room air on the date of his presentation.  Upon arrival in the ED his saturations were 78% on room air.  CXR noted patchy multifocal opacities bilaterally.  Significant Events: 1/12 admit via  1/13 transfer to [facility]  COVID-19 specific Treatment: Decadron 1/12 > Remdesivir 1/13 >  Antimicrobials:  Azithromycin 1/12 Ceftriaxone 1/12  Subjective: Patient was evaluated by Dr. Karleen Hampshire earlier today.  Assessment & Plan:  COVID Pneumonia -acute hypoxic respiratory failure Presently relatively stable on 2 L nasal cannula -continue remdesivir and Decadron  Recent Labs  Lab 02/27/19 1737 02/27/19 1922 02/28/19 0534  DDIMER  --  >20.00* >20.00*  FERRITIN  --  826*  --   CRP  --  20.5* 20.5*  ALT 81*  --  68*  PROCALCITON  --  0.41 0.19    Modestly elevated procalcitonin Has trended downward significantly over the last 24 hours -discontinue antibiotic therapy and follow for now  DM 2 CBG reasonably controlled at this time  HTN Blood pressures poorly controlled -adjust medical therapy  Transaminitis  DVT prophylaxis: intermediate dose Lovenox Code Status: FULL CODE Family Communication:  Disposition Plan:   Consultants:  none  Objective: Blood pressure (!) 162/101, pulse 82, temperature 98.9 F (37.2 C), temperature source Oral, resp. rate (!) 29, height 5\' 11"  (1.803 m), weight 98 kg, SpO2 91 %.  Intake/Output Summary (Last 24 hours) at 02/28/2019 1608 Last data filed at 02/28/2019 1207 Gross per 24 hour  Intake 114.52 ml  Output 350 ml  Net -235.48 ml   Filed Weights   02/28/19 0104 02/28/19 1410  Weight: 96.6 kg 98 kg     Examination:   CBC: Recent Labs  Lab 02/27/19 1737 02/28/19 0534  WBC 8.6 7.4  NEUTROABS 6.7 6.0  HGB 14.7 14.8  HCT 44.8 45.9  MCV 93.7 94.8  PLT 300 [id number]   Basic Metabolic Panel: Recent Labs  Lab 02/27/19 1737 02/28/19 0534  NA 137 139  K 3.6 4.9  CL 102 104  CO2 23 25  GLUCOSE 196* 148*  BUN 16 18  CREATININE 1.01 0.92  CALCIUM 8.5* 8.2*   GFR: Estimated Creatinine Clearance: 93 mL/min (by C-G formula based on SCr of 0.92 mg/dL).  Liver Function Tests: Recent Labs  Lab 02/27/19 1737 02/28/19 0534  AST 80* 66*  ALT 81* 68*  ALKPHOS 82 80  BILITOT 0.4 1.2  PROT 6.9 6.7  ALBUMIN 2.5* 2.4*    HbA1C: Hgb A1c MFr Bld  Date/Time Value Ref Range Status  02/27/2019 10:31 PM 8.3 (H) 4.8 - 5.6 % Final    Comment:    (NOTE) Pre diabetes:          5.7%-6.4% Diabetes:              >6.4% Glycemic control for   <7.0% adults with diabetes     CBG: Recent Labs  Lab 02/28/19 0221 02/28/19 0405 02/28/19 0756 02/28/19 1309  GLUCAP 154* 171* 134* 153*    Recent Results (from the past 240 hour(s))  Blood Culture (routine x 2)     Status: None (Preliminary result)   Collection Time: 02/27/19  7:15 PM  Specimen: BLOOD  Result Value Ref Range Status   Specimen Description BLOOD RIGHT ANTECUBITAL  Final   Special Requests   Final    BOTTLES DRAWN AEROBIC AND ANAEROBIC Blood Culture adequate volume   Culture   Final    NO GROWTH < 24 HOURS Performed at [facility], [address] [place], [place] [address]    Report Status PENDING  Incomplete  Blood Culture (routine x 2)     Status: None (Preliminary result)   Collection Time: 02/27/19  7:22 PM   Specimen: BLOOD  Result Value Ref Range Status   Specimen Description BLOOD LEFT ANTECUBITAL  Final   Special Requests   Final    BOTTLES DRAWN AEROBIC AND ANAEROBIC Blood Culture adequate volume   Culture   Final    NO GROWTH < 24 HOURS Performed at [facility], [place] [address]  [place], [place] [address]    Report Status PENDING  Incomplete  SARS CORONAVIRUS 2 (TAT 6-24 HRS) Nasopharyngeal Nasopharyngeal Swab     Status: Abnormal   Collection Time: 02/27/19 10:31 PM   Specimen: Nasopharyngeal Swab  Result Value Ref Range Status   SARS Coronavirus 2 POSITIVE (A) NEGATIVE Final    Comment: RESULT CALLED TO, READ BACK BY AND VERIFIED WITH: RN  JOE, B. AT 0512 BY MESSAN H. ON 02/28/2019 (NOTE) SARS-CoV-2 target nucleic acids are DETECTED. The SARS-CoV-2 RNA is generally detectable in upper and lower respiratory specimens during the acute phase of infection. Positive results are indicative of the presence of SARS-CoV-2 RNA. Clinical correlation with patient history and other diagnostic information is  necessary to determine patient infection status. Positive results do not rule out bacterial infection or co-infection with other viruses.  The expected result is Negative. Fact Sheet for Patients: SugarRoll.be Fact Sheet for Healthcare Providers: https://www.woods-mathews.com/ This test is not yet approved or cleared by the [place] FDA and  has been authorized for detection and/or diagnosis of SARS-CoV-2 by FDA under an Emergency Use Authorization (EUA). This EUA will remain  in effect (meaning this test can be use d) for the duration of the COVID-19 declaration under Section 564(b)(1) of the Act, 21 U.S.C. section 360bbb-3(b)(1), unless the authorization is terminated or revoked sooner. Performed at [facility], [place] [address], [place], [place] [address]      Scheduled Meds: . vitamin C  500 mg Oral Daily  . atorvastatin  20 mg Oral QHS  . dexamethasone (DECADRON) injection  6 mg Intravenous Q24H  . insulin aspart  0-9 Units Subcutaneous TID WC  . insulin glargine  12 Units Subcutaneous QHS  . zinc sulfate  220 mg Oral Daily   Continuous Infusions: . azithromycin Stopped (02/28/19 0425)  . cefTRIAXone  (ROCEPHIN)  IV Stopped (02/27/19 2357)  . remdesivir 200 mg in sodium chloride 0.9% 250 mL IVPB     Followed by  . [START ON 03/01/2019] remdesivir 100 mg in NS 100 mL       LOS: 1 day   Cherene Altes, MD [facility] Office  [phone] Pager - Text Page per Amion  If 7PM-7AM, please contact night-coverage per Amion 02/28/2019, 4:08 PM

## 2019-02-28 NOTE — Progress Notes (Signed)
ANTICOAGULATION CONSULT NOTE - Initial Consult  Pharmacy Consult for Heparin Indication: r/o PE  No Known Allergies  Patient Measurements:  IBW 75.3kg TBW: 96.6kg Heparin Dosing Weight:  95 kg  Vital Signs: Temp: 98.4 F (36.9 C) (01/12 1646) Temp Source: Oral (01/12 1646) BP: 146/97 (01/12 2358) Pulse Rate: 85 (01/12 2358)  Labs: Recent Labs    02/27/19 1737  HGB 14.7  HCT 44.8  PLT 300  CREATININE 1.01    CrCl cannot be calculated (Unknown ideal weight.).   Medical History: Past Medical History:  Diagnosis Date  . Diabetes mellitus without complication ([place])   . Hypertension     Assessment: CC/HPI: low O2 sats after testing COVID + last week. R/o PNA vs PE.  PMH:  DM, HTN,  Anticoag: r/o PE. Baseline CBC WNL. Ddimer >20,  Goal of Therapy:  Heparin level 0.3-0.7 units/ml Monitor platelets by anticoagulation protocol: Yes   Plan:  Heparin bolus 5000 units Heparin infusion 1700 units/hr Hep level in 8 hrs Daily HL and CBC F/u CT scan results.   Normand Damron S. Alford Highland, PharmD, BCPS Clinical Staff Pharmacist [email or web address] Alford Highland, [employer] 02/28/2019,12:47 AM

## 2019-02-28 NOTE — ED Notes (Signed)
Attempted to give report to [facility] Nurse

## 2019-02-28 NOTE — Plan of Care (Signed)
  Problem: Education: Goal: Knowledge of risk factors and measures for prevention of condition will improve Outcome: Progressing   Problem: Coping: Goal: Psychosocial and spiritual needs will be supported Outcome: Progressing   Problem: Respiratory: Goal: Will maintain a patent airway Outcome: Progressing Goal: Complications related to the disease process, condition or treatment will be avoided or minimized Outcome: Progressing   Problem: Education: Goal: Knowledge of General Education information will improve Description: Including pain rating scale, medication(s)/side effects and non-pharmacologic comfort measures Outcome: Progressing   Problem: Health Behavior/Discharge Planning: Goal: Ability to manage health-related needs will improve Outcome: Progressing   Problem: Clinical Measurements: Goal: Ability to maintain clinical measurements within normal limits will improve Outcome: Progressing Goal: Will remain free from infection Outcome: Progressing Goal: Diagnostic test results will improve Outcome: Progressing Goal: Respiratory complications will improve Outcome: Progressing Goal: Cardiovascular complication will be avoided Outcome: Progressing   Problem: Activity: Goal: Risk for activity intolerance will decrease Outcome: Progressing   Problem: Nutrition: Goal: Adequate nutrition will be maintained Outcome: Progressing

## 2019-02-28 NOTE — ED Provider Notes (Signed)
Randy Baker (son): [phone] Please call for any updates.   Merryl Hacker, MD 02/28/19 [phone]

## 2019-03-01 ENCOUNTER — Inpatient Hospital Stay (HOSPITAL_COMMUNITY): Payer: Medicare PPO

## 2019-03-01 DIAGNOSIS — R791 Abnormal coagulation profile: Secondary | ICD-10-CM

## 2019-03-01 LAB — GLUCOSE, CAPILLARY
Glucose-Capillary: 227 mg/dL — ABNORMAL HIGH (ref 70–99)
Glucose-Capillary: 266 mg/dL — ABNORMAL HIGH (ref 70–99)
Glucose-Capillary: 287 mg/dL — ABNORMAL HIGH (ref 70–99)
Glucose-Capillary: 206 mg/dL — ABNORMAL HIGH (ref 70–99)

## 2019-03-01 LAB — CBC WITH DIFFERENTIAL/PLATELET
Abs Immature Granulocytes: 0.11 10*3/uL — ABNORMAL HIGH (ref 0.00–0.07)
Basophils Absolute: 0 10*3/uL (ref 0.0–0.1)
Basophils Relative: 0 %
Eosinophils Absolute: 0 10*3/uL (ref 0.0–0.5)
Eosinophils Relative: 0 %
HCT: 43.7 % (ref 39.0–52.0)
Hemoglobin: 14.2 g/dL (ref 13.0–17.0)
Immature Granulocytes: 1 %
Lymphocytes Relative: 11 %
Lymphs Abs: 1 10*3/uL (ref 0.7–4.0)
MCH: 30.7 pg (ref 26.0–34.0)
MCHC: 32.5 g/dL (ref 30.0–36.0)
MCV: 94.4 fL (ref 80.0–100.0)
Monocytes Absolute: 0.3 10*3/uL (ref 0.1–1.0)
Monocytes Relative: 4 %
Neutro Abs: 8.2 10*3/uL — ABNORMAL HIGH (ref 1.7–7.7)
Neutrophils Relative %: 84 %
Platelets: 314 10*3/uL (ref 150–400)
RBC: 4.63 MIL/uL (ref 4.22–5.81)
RDW: 12.9 % (ref 11.5–15.5)
WBC: 9.6 10*3/uL (ref 4.0–10.5)
nRBC: 0.2 % (ref 0.0–0.2)

## 2019-03-01 LAB — COMPREHENSIVE METABOLIC PANEL
ALT: 61 U/L — ABNORMAL HIGH (ref 0–44)
AST: 46 U/L — ABNORMAL HIGH (ref 15–41)
Albumin: 2.5 g/dL — ABNORMAL LOW (ref 3.5–5.0)
Alkaline Phosphatase: 69 U/L (ref 38–126)
Anion gap: 10 (ref 5–15)
BUN: 24 mg/dL — ABNORMAL HIGH (ref 8–23)
CO2: 22 mmol/L (ref 22–32)
Calcium: 8.1 mg/dL — ABNORMAL LOW (ref 8.9–10.3)
Chloride: 105 mmol/L (ref 98–111)
Creatinine, Ser: 0.83 mg/dL (ref 0.61–1.24)
GFR calc Af Amer: >60 mL/min (ref >60)
GFR calc non Af Amer: >60 mL/min (ref >60)
Glucose, Bld: 211 mg/dL — ABNORMAL HIGH (ref 70–99)
Potassium: 4.2 mmol/L (ref 3.5–5.1)
Sodium: 137 mmol/L (ref 135–145)
Total Bilirubin: 0.7 mg/dL (ref 0.3–1.2)
Total Protein: 6.7 g/dL (ref 6.5–8.1)

## 2019-03-01 LAB — D-DIMER, QUANTITATIVE: D-Dimer, Quant: >20 ug/mL-FEU — ABNORMAL HIGH (ref 0.00–0.50)

## 2019-03-01 LAB — C-REACTIVE PROTEIN: CRP: 11.9 mg/dL — ABNORMAL HIGH (ref <1.0)

## 2019-03-01 LAB — PROCALCITONIN: Procalcitonin: 0.1 ng/mL

## 2019-03-01 MED ORDER — MENTHOL 3 MG MT LOZG
1.0000 | LOZENGE | OROMUCOSAL | Status: DC | PRN
  Filled 2019-03-01: qty 9

## 2019-03-01 MED ORDER — INSULIN GLARGINE 100 UNIT/ML ~~LOC~~ SOLN
12.0000 [IU] | Freq: Two times a day (BID) | SUBCUTANEOUS | Status: DC
  Administered 2019-03-01 – 2019-03-03 (×5): 12 [IU] via SUBCUTANEOUS
  Filled 2019-03-01 (×6): qty 0.12

## 2019-03-01 MED ORDER — ENOXAPARIN SODIUM 60 MG/0.6ML ~~LOC~~ SOLN
50.0000 mg | SUBCUTANEOUS | Status: AC
  Administered 2019-03-01: 50 mg via SUBCUTANEOUS
  Filled 2019-03-01: qty 0.6

## 2019-03-01 MED ORDER — INSULIN ASPART 100 UNIT/ML ~~LOC~~ SOLN
4.0000 [IU] | Freq: Three times a day (TID) | SUBCUTANEOUS | Status: DC
  Administered 2019-03-01 – 2019-03-03 (×6): 4 [IU] via SUBCUTANEOUS

## 2019-03-01 MED ORDER — ENOXAPARIN SODIUM 100 MG/ML ~~LOC~~ SOLN
1.0000 mg/kg | Freq: Two times a day (BID) | SUBCUTANEOUS | Status: DC
  Administered 2019-03-01 – 2019-03-02 (×2): 100 mg via SUBCUTANEOUS
  Filled 2019-03-01 (×3): qty 1

## 2019-03-01 NOTE — Progress Notes (Signed)
ANTICOAGULATION CONSULT NOTE - Follow Up Consult  Pharmacy Consult for Lovenox Indication: DVT  No Known Allergies  Patient Measurements: Height: 5\' 11"  (180.3 cm) Weight: 216 lb (98 kg) IBW/kg (Calculated) : 75.3 Heparin Dosing Weight:  Vital Signs: Temp: 98.7 F (37.1 C) (01/14 0800) Temp Source: Oral (01/14 0800) BP: 132/89 (01/14 0800) Pulse Rate: 64 (01/14 0800)  Labs: Recent Labs    02/27/19 1737 02/27/19 1737 02/28/19 0534 03/01/19 0254  HGB 14.7   < > 14.8 14.2  HCT 44.8  --  45.9 43.7  PLT 300  --  299 314  CREATININE 1.01  --  0.92 0.83   < > = values in this interval not displayed.    Estimated Creatinine Clearance: 103.1 mL/min (by C-G formula based on SCr of 0.83 mg/dL).   Medications:  Scheduled:  . amLODipine  5 mg Oral Daily   And  . irbesartan  150 mg Oral Daily  . vitamin C  500 mg Oral Daily  . atorvastatin  20 mg Oral QHS  . dexamethasone (DECADRON) injection  6 mg Intravenous Q24H  . enoxaparin (LOVENOX) injection  0.5 mg/kg Subcutaneous Q12H  . insulin aspart  0-9 Units Subcutaneous TID WC  . insulin glargine  12 Units Subcutaneous BID  . linagliptin  5 mg Oral Daily  . zinc sulfate  220 mg Oral Daily   Infusions:  . remdesivir 100 mg in NS 100 mL 100 mg (03/01/19 1032)    Assessment: Randy Baker admitted on 1/12 with COVID-19.  He was found to have acute DVT on 1/14 and pharmacy is consulted to transition from prophylactic Lovenox to treatment dose Lovenox.  CBC and SCr are stable/wnl.  Goal of Therapy:  Anti-Xa level 0.6-1 units/ml 4hrs after LMWH dose given Monitor platelets by anticoagulation protocol: Yes   Plan:  Lovenox 100mg  [name] q12h CBC q72h Follow up long-term anticoagulation plans.  [name] PharmD, BCPS Clinical pharmacist phone 7am- 5pm: [phone] 03/01/2019 12:16 PM

## 2019-03-01 NOTE — Progress Notes (Addendum)
Randy Baker  [id number] DOB: 1950/06/20 DOA: 02/27/2019 PCP: Lavone Orn, MD    Brief Narrative:  69 year old with a history of DM and HTN who presented to the ED via EMS with severe shortness of breath.  He reported testing positive for Covid the preceding week, and discovered that his saturations were 85% on room air at home on the date of his presentation.  Upon arrival in the ED his saturations were 78% on room air.  CXR noted patchy multifocal opacities bilaterally.  Significant Events: 1/12 admit via [place] 1/13 transfer to [facility] 1/14 doppler + for R LE DVT   COVID-19 specific Treatment: Decadron 1/12 > Remdesivir 1/13 >  Antimicrobials:  Azithromycin 1/12 Ceftriaxone 1/12  Subjective: Maintaining oxygen saturations in the upper 90s on 2 L nasal cannula only.  Vital signs stable otherwise.  I have reviewed the patient's CT chest and note significant diffuse right lung infiltrate with scattered slightly less prominent left lung infiltrate.  Assessment & Plan:  COVID Pneumonia -acute hypoxic respiratory failure continue remdesivir and decadron - procalcitonin has rapidly declined therefore abx stopped - weaning O2 - ambulate/PT/OT   Recent Labs  Lab 02/27/19 1737 02/27/19 1922 02/28/19 0534 03/01/19 0254  DDIMER  --  >20.00* >20.00* >20.00*  FERRITIN  --  826*  --   --   CRP  --  20.5* 20.5* 11.9*  ALT 81*  --  68* 61*  PROCALCITON  --  0.41 0.19 0.10    DVT - Markedly elevated d-dimer  No PE per CTa - LE venous duplex + for peroneal DVT - change to full dose lovenox w/ plan to transition to Eliquis prior to d/c    Modestly elevated procalcitonin Has trended downward significantly -discontinued antibiotic therapy   DM 2 CBG climbing - adjust tx and follow - A1c 8.3  HTN Medical therapy adjusted yesterday -continue to follow trend  Transaminitis Likely due to Covid and/or remdesivir -follow trend  DVT prophylaxis: intermediate dose  Lovenox Code Status: FULL CODE Family Communication:  Disposition Plan: Downgrade to telemetry bed  Consultants:  none  Objective: Blood pressure (!) 147/92, pulse 67, temperature 98.6 F (37 C), temperature source Oral, resp. rate 16, height 5\' 11"  (1.803 m), weight 98 kg, SpO2 (!) 88 %.  Intake/Output Summary (Last 24 hours) at 03/01/2019 0757 Last data filed at 03/01/2019 0425 Gross per 24 hour  Intake 694.52 ml  Output 1000 ml  Net -305.48 ml   Filed Weights   02/28/19 0104 02/28/19 1410  Weight: 96.6 kg 98 kg    Examination: General: No acute respiratory distress Lungs: mild crackles B - no wheezing  Cardiovascular: RRR w/o M or rub  Abdomen: Nontender, nondistended, soft, bowel sounds positive, no rebound, no ascites, no appreciable mass Extremities: No edema B LE    CBC: Recent Labs  Lab 02/27/19 1737 02/28/19 0534 03/01/19 0254  WBC 8.6 7.4 9.6  NEUTROABS 6.7 6.0 8.2*  HGB 14.7 14.8 14.2  HCT 44.8 45.9 43.7  MCV 93.7 94.8 94.4  PLT 300 299 [id number]   Basic Metabolic Panel: Recent Labs  Lab 02/27/19 1737 02/28/19 0534 03/01/19 0254  NA 137 139 137  K 3.6 4.9 4.2  CL 102 104 105  CO2 23 25 22   GLUCOSE 196* 148* 211*  BUN 16 18 24*  CREATININE 1.01 0.92 0.83  CALCIUM 8.5* 8.2* 8.1*   GFR: Estimated Creatinine Clearance: 103.1 mL/min (by C-G formula based on SCr of 0.83 mg/dL).  Liver Function Tests: Recent Labs  Lab 02/27/19 1737 02/28/19 0534 03/01/19 0254  AST 80* 66* 46*  ALT 81* 68* 61*  ALKPHOS 82 80 69  BILITOT 0.4 1.2 0.7  PROT 6.9 6.7 6.7  ALBUMIN 2.5* 2.4* 2.5*    HbA1C: Hgb A1c MFr Bld  Date/Time Value Ref Range Status  02/27/2019 10:31 PM 8.3 (H) 4.8 - 5.6 % Final    Comment:    (NOTE) Pre diabetes:          5.7%-6.4% Diabetes:              >6.4% Glycemic control for   <7.0% adults with diabetes     CBG: Recent Labs  Lab 02/28/19 0756 02/28/19 1309 02/28/19 1615 02/28/19 2110 03/01/19 0702  GLUCAP 134* 153*  176* 277* 227*    Recent Results (from the past 240 hour(s))  Blood Culture (routine x 2)     Status: None (Preliminary result)   Collection Time: 02/27/19  7:15 PM   Specimen: BLOOD  Result Value Ref Range Status   Specimen Description BLOOD RIGHT ANTECUBITAL  Final   Special Requests   Final    BOTTLES DRAWN AEROBIC AND ANAEROBIC Blood Culture adequate volume   Culture   Final    NO GROWTH < 24 HOURS Performed at [facility], [place] [address], [place], [place] [address]    Report Status PENDING  Incomplete  Blood Culture (routine x 2)     Status: None (Preliminary result)   Collection Time: 02/27/19  7:22 PM   Specimen: BLOOD  Result Value Ref Range Status   Specimen Description BLOOD LEFT ANTECUBITAL  Final   Special Requests   Final    BOTTLES DRAWN AEROBIC AND ANAEROBIC Blood Culture adequate volume   Culture   Final    NO GROWTH < 24 HOURS Performed at [facility], [place] [address] [place], [place] [address]    Report Status PENDING  Incomplete  SARS CORONAVIRUS 2 (TAT 6-24 HRS) Nasopharyngeal Nasopharyngeal Swab     Status: Abnormal   Collection Time: 02/27/19 10:31 PM   Specimen: Nasopharyngeal Swab  Result Value Ref Range Status   SARS Coronavirus 2 POSITIVE (A) NEGATIVE Final    Comment: RESULT CALLED TO, READ BACK BY AND VERIFIED WITH: RN  JOE, B. AT 0512 BY MESSAN H. ON 02/28/2019 (NOTE) SARS-CoV-2 target nucleic acids are DETECTED. The SARS-CoV-2 RNA is generally detectable in upper and lower respiratory specimens during the acute phase of infection. Positive results are indicative of the presence of SARS-CoV-2 RNA. Clinical correlation with patient history and other diagnostic information is  necessary to determine patient infection status. Positive results do not rule out bacterial infection or co-infection with other viruses.  The expected result is Negative. Fact Sheet for Patients: SugarRoll.be Fact Sheet for  Healthcare Providers: https://www.woods-mathews.com/ This test is not yet approved or cleared by the [place] FDA and  has been authorized for detection and/or diagnosis of SARS-CoV-2 by FDA under an Emergency Use Authorization (EUA). This EUA will remain  in effect (meaning this test can be use d) for the duration of the COVID-19 declaration under Section 564(b)(1) of the Act, 21 U.S.C. section 360bbb-3(b)(1), unless the authorization is terminated or revoked sooner. Performed at [facility], [place] [address], [place], [place] [address]      Scheduled Meds: . amLODipine  5 mg Oral Daily   And  . irbesartan  150 mg Oral Daily  .  vitamin C  500 mg Oral Daily  . atorvastatin  20 mg Oral QHS  . dexamethasone (DECADRON) injection  6 mg Intravenous Q24H  . enoxaparin (LOVENOX) injection  0.5 mg/kg Subcutaneous Q12H  . insulin aspart  0-9 Units Subcutaneous TID WC  . insulin glargine  12 Units Subcutaneous QHS  . linagliptin  5 mg Oral Daily  . zinc sulfate  220 mg Oral Daily   Continuous Infusions: . remdesivir 100 mg in NS 100 mL       LOS: 2 days   Cherene Altes, MD [facility] Office  [phone] Pager - Text Page per Amion  If 7PM-7AM, please contact night-coverage per Amion 03/01/2019, 7:57 AM

## 2019-03-01 NOTE — Progress Notes (Signed)
PT Cancellation Note  Patient Details Name: Randy Baker MRN: [id number] DOB: [birth year]   Cancelled Treatment:    Reason Eval/Treat Not Completed: Medical issues which prohibited therapy. Pt with new LE DVT's. Treatment dose of lovenox to be given. Per therapy protocol will defer evaluation until at least 3 hours post initial lovenox treatment dose. Will evaluate tomorrow.    Shary Decamp [facility] 03/01/2019, 3:36 PM  [place] Pager [phone] Office [phone]

## 2019-03-01 NOTE — Progress Notes (Signed)
Results for NABIL, CURLESS (MRN [id number]) as of 03/01/2019 14:43  Ref. Range 02/28/2019 16:15 02/28/2019 21:10 03/01/2019 07:02 03/01/2019 11:12  Glucose-Capillary Latest Ref Range: 70 - 99 mg/dL 176 (H) 277 (H) 227 (H) 266 (H)  Noted that CBGs continue to be greater than 200 mg/dl.  Recommend adding Novolog 4 units TID as meal coverage if patient eats at least 50% of meal.   Harvel Ricks RN BSN CDE Diabetes Coordinator Pager: [phone]  8am-5pm

## 2019-03-01 NOTE — Progress Notes (Signed)
Bilateral lower extremity venous duplex completed.  Critical results discussed with Dr. Thereasa Solo.  03/01/2019 11:41 AM Kelby Aline., MHA, RVT, RDCS, RDMS

## 2019-03-02 LAB — COMPREHENSIVE METABOLIC PANEL
ALT: 58 U/L — ABNORMAL HIGH (ref 0–44)
AST: 38 U/L (ref 15–41)
Albumin: 2.3 g/dL — ABNORMAL LOW (ref 3.5–5.0)
Alkaline Phosphatase: 67 U/L (ref 38–126)
Anion gap: 10 (ref 5–15)
BUN: 28 mg/dL — ABNORMAL HIGH (ref 8–23)
CO2: 22 mmol/L (ref 22–32)
Calcium: 8.3 mg/dL — ABNORMAL LOW (ref 8.9–10.3)
Chloride: 105 mmol/L (ref 98–111)
Creatinine, Ser: 0.78 mg/dL (ref 0.61–1.24)
GFR calc Af Amer: >60 mL/min (ref >60)
GFR calc non Af Amer: >60 mL/min (ref >60)
Glucose, Bld: 156 mg/dL — ABNORMAL HIGH (ref 70–99)
Potassium: 4.2 mmol/L (ref 3.5–5.1)
Sodium: 137 mmol/L (ref 135–145)
Total Bilirubin: 0.3 mg/dL (ref 0.3–1.2)
Total Protein: 6.2 g/dL — ABNORMAL LOW (ref 6.5–8.1)

## 2019-03-02 LAB — CBC WITH DIFFERENTIAL/PLATELET
Abs Immature Granulocytes: 0.06 10*3/uL (ref 0.00–0.07)
Basophils Absolute: 0 10*3/uL (ref 0.0–0.1)
Basophils Relative: 0 %
Eosinophils Absolute: 0 10*3/uL (ref 0.0–0.5)
Eosinophils Relative: 0 %
HCT: 41.4 % (ref 39.0–52.0)
Hemoglobin: 13.6 g/dL (ref 13.0–17.0)
Immature Granulocytes: 1 %
Lymphocytes Relative: 10 %
Lymphs Abs: 0.9 10*3/uL (ref 0.7–4.0)
MCH: 30.8 pg (ref 26.0–34.0)
MCHC: 32.9 g/dL (ref 30.0–36.0)
MCV: 93.7 fL (ref 80.0–100.0)
Monocytes Absolute: 0.4 10*3/uL (ref 0.1–1.0)
Monocytes Relative: 5 %
Neutro Abs: 7.7 10*3/uL (ref 1.7–7.7)
Neutrophils Relative %: 84 %
Platelets: 357 10*3/uL (ref 150–400)
RBC: 4.42 MIL/uL (ref 4.22–5.81)
RDW: 12.7 % (ref 11.5–15.5)
WBC: 9.1 10*3/uL (ref 4.0–10.5)
nRBC: 0 % (ref 0.0–0.2)

## 2019-03-02 LAB — GLUCOSE, CAPILLARY
Glucose-Capillary: 156 mg/dL — ABNORMAL HIGH (ref 70–99)
Glucose-Capillary: 157 mg/dL — ABNORMAL HIGH (ref 70–99)
Glucose-Capillary: 220 mg/dL — ABNORMAL HIGH (ref 70–99)
Glucose-Capillary: 153 mg/dL — ABNORMAL HIGH (ref 70–99)

## 2019-03-02 LAB — C-REACTIVE PROTEIN: CRP: 5.6 mg/dL — ABNORMAL HIGH (ref <1.0)

## 2019-03-02 LAB — D-DIMER, QUANTITATIVE: D-Dimer, Quant: 10.04 ug/mL-FEU — ABNORMAL HIGH (ref 0.00–0.50)

## 2019-03-02 LAB — FERRITIN: Ferritin: 754 ng/mL — ABNORMAL HIGH (ref 24–336)

## 2019-03-02 MED ORDER — DEXAMETHASONE 6 MG PO TABS
6.0000 mg | ORAL_TABLET | Freq: Every day | ORAL | Status: DC
  Administered 2019-03-03: 10:00:00 6 mg via ORAL
  Filled 2019-03-02: qty 1

## 2019-03-02 MED ORDER — APIXABAN 5 MG PO TABS
10.0000 mg | ORAL_TABLET | Freq: Two times a day (BID) | ORAL | Status: DC
  Administered 2019-03-02 – 2019-03-03 (×2): 10 mg via ORAL
  Filled 2019-03-02 (×2): qty 2

## 2019-03-02 MED ORDER — APIXABAN 5 MG PO TABS: 5.0000 mg | ORAL_TABLET | Freq: Two times a day (BID) | ORAL | Status: DC

## 2019-03-02 NOTE — Plan of Care (Signed)
  Problem: Education: Goal: Knowledge of risk factors and measures for prevention of condition will improve Outcome: Progressing   Problem: Coping: Goal: Psychosocial and spiritual needs will be supported Outcome: Progressing   Problem: Respiratory: Goal: Will maintain a patent airway Outcome: Progressing Goal: Complications related to the disease process, condition or treatment will be avoided or minimized Outcome: Progressing   

## 2019-03-02 NOTE — Progress Notes (Signed)
Randy Baker  [id number] DOB: 1950/11/17 DOA: 02/27/2019 PCP: Lavone Orn, MD    Brief Narrative:  69 year old with a history of DM and HTN who presented to the ED via EMS with severe shortness of breath.  He reported testing positive for Covid the preceding week, and discovered that his saturations were 85% on room air at home on the date of his presentation.  Upon arrival in the ED his saturations were 78% on room air.  CXR noted patchy multifocal opacities bilaterally.  Significant Events: 1/12 admit via [place] 1/13 transfer to [facility] 1/14 doppler + for R LE DVT   COVID-19 specific Treatment: Decadron 1/12 > Remdesivir 1/13 >  Antimicrobials:  Azithromycin 1/12 Ceftriaxone 1/12  Subjective: Improving nicely. Denies new complaints. Plan is to ambulate w/ PT/OT today and monitor ambulatory sats. Potential for d/c home 1/16.   Assessment & Plan:  COVID Pneumonia -acute hypoxic respiratory failure continue remdesivir and decadron - procalcitonin has rapidly declined therefore abx stopped - weaning O2 - ambulate/PT/OT   Recent Labs  Lab 02/27/19 1737 02/27/19 1922 02/28/19 0534 03/01/19 0254 03/02/19 0118  DDIMER  --  >20.00* >20.00* >20.00* 10.04*  FERRITIN  --  826*  --   --  754*  CRP  --  20.5* 20.5* 11.9* 5.6*  ALT 81*  --  68* 61* 58*  PROCALCITON  --  0.41 0.19 0.10  --     DVT - Markedly elevated d-dimer  No PE per CTa - LE venous duplex + for peroneal DVT - transition to Eliquis this evening w/ plan for a 3 month course of tx   Modestly elevated procalcitonin Has trended downward significantly -discontinued antibiotic therapy - clinically stable   DM 2 CBG improved today - A1c 8.3  HTN Medical therapy adjusted yesterday - follow w/o further change today   Transaminitis Likely due to Covid and/or remdesivir - improving   DVT prophylaxis: intermediate dose Lovenox Code Status: FULL CODE Family Communication:  Disposition Plan:  discontinue tele - ambulate - probable d/c home 1/6 on eliquis   Consultants:  none  Objective: Blood pressure (!) 152/99, pulse 72, temperature 98.6 F (37 C), temperature source Oral, resp. rate 18, height 5\' 11"  (1.803 m), weight 98 kg, SpO2 98 %.  Intake/Output Summary (Last 24 hours) at 03/02/2019 1526 Last data filed at 03/02/2019 0800 Gross per 24 hour  Intake 120 ml  Output 700 ml  Net -580 ml   Filed Weights   02/28/19 0104 02/28/19 1410  Weight: 96.6 kg 98 kg    Examination: General: No acute respiratory distress Lungs: mild crackles B w/o wheeze  Cardiovascular: RRR w/o M or rub  Abdomen: Nontender, nondistended, soft, bowel sounds positive Extremities: No edema B LE    CBC: Recent Labs  Lab 02/28/19 0534 03/01/19 0254 03/02/19 0118  WBC 7.4 9.6 9.1  NEUTROABS 6.0 8.2* 7.7  HGB 14.8 14.2 13.6  HCT 45.9 43.7 41.4  MCV 94.8 94.4 93.7  PLT 299 314 [id number]   Basic Metabolic Panel: Recent Labs  Lab 02/28/19 0534 03/01/19 0254 03/02/19 0118  NA 139 137 137  K 4.9 4.2 4.2  CL 104 105 105  CO2 25 22 22   GLUCOSE 148* 211* 156*  BUN 18 24* 28*  CREATININE 0.92 0.83 0.78  CALCIUM 8.2* 8.1* 8.3*   GFR: Estimated Creatinine Clearance: 107 mL/min (by C-G formula based on SCr of 0.78 mg/dL).  Liver Function Tests: Recent Labs  Lab 02/27/19 1737  02/28/19 0534 03/01/19 0254 03/02/19 0118  AST 80* 66* 46* 38  ALT 81* 68* 61* 58*  ALKPHOS 82 80 69 67  BILITOT 0.4 1.2 0.7 0.3  PROT 6.9 6.7 6.7 6.2*  ALBUMIN 2.5* 2.4* 2.5* 2.3*    HbA1C: Hgb A1c MFr Bld  Date/Time Value Ref Range Status  02/27/2019 10:31 PM 8.3 (H) 4.8 - 5.6 % Final    Comment:    (NOTE) Pre diabetes:          5.7%-6.4% Diabetes:              >6.4% Glycemic control for   <7.0% adults with diabetes     CBG: Recent Labs  Lab 03/01/19 1112 03/01/19 1601 03/01/19 1927 03/02/19 0743 03/02/19 1207  GLUCAP 266* 287* 206* 156* 157*    Recent Results (from the past 240  hour(s))  Blood Culture (routine x 2)     Status: None (Preliminary result)   Collection Time: 02/27/19  7:15 PM   Specimen: BLOOD  Result Value Ref Range Status   Specimen Description BLOOD RIGHT ANTECUBITAL  Final   Special Requests   Final    BOTTLES DRAWN AEROBIC AND ANAEROBIC Blood Culture adequate volume   Culture   Final    NO GROWTH 2 DAYS Performed at [facility], [place] [address], [place], [place] [address]    Report Status PENDING  Incomplete  Blood Culture (routine x 2)     Status: None (Preliminary result)   Collection Time: 02/27/19  7:22 PM   Specimen: BLOOD  Result Value Ref Range Status   Specimen Description BLOOD LEFT ANTECUBITAL  Final   Special Requests   Final    BOTTLES DRAWN AEROBIC AND ANAEROBIC Blood Culture adequate volume   Culture   Final    NO GROWTH 2 DAYS Performed at [facility], [place] [address], [place], [place] [address]    Report Status PENDING  Incomplete  SARS CORONAVIRUS 2 (TAT 6-24 HRS) Nasopharyngeal Nasopharyngeal Swab     Status: Abnormal   Collection Time: 02/27/19 10:31 PM   Specimen: Nasopharyngeal Swab  Result Value Ref Range Status   SARS Coronavirus 2 POSITIVE (A) NEGATIVE Final    Comment: RESULT CALLED TO, READ BACK BY AND VERIFIED WITH: RN  JOE, B. AT 0512 BY MESSAN H. ON 02/28/2019 (NOTE) SARS-CoV-2 target nucleic acids are DETECTED. The SARS-CoV-2 RNA is generally detectable in upper and lower respiratory specimens during the acute phase of infection. Positive results are indicative of the presence of SARS-CoV-2 RNA. Clinical correlation with patient history and other diagnostic information is  necessary to determine patient infection status. Positive results do not rule out bacterial infection or co-infection with other viruses.  The expected result is Negative. Fact Sheet for Patients: SugarRoll.be Fact Sheet for Healthcare  Providers: https://www.woods-mathews.com/ This test is not yet approved or cleared by the [place] FDA and  has been authorized for detection and/or diagnosis of SARS-CoV-2 by FDA under an Emergency Use Authorization (EUA). This EUA will remain  in effect (meaning this test can be use d) for the duration of the COVID-19 declaration under Section 564(b)(1) of the Act, 21 U.S.C. section 360bbb-3(b)(1), unless the authorization is terminated or revoked sooner. Performed at [facility], [place] [address], [place], [place] [address]      Scheduled Meds: . amLODipine  5 mg Oral Daily   And  . irbesartan  150 mg Oral Daily  . vitamin C  500 mg Oral Daily  . atorvastatin  20 mg Oral QHS  . dexamethasone (DECADRON) injection  6 mg Intravenous Q24H  . enoxaparin (LOVENOX) injection  1 mg/kg Subcutaneous Q12H  . insulin aspart  0-9 Units Subcutaneous TID WC  . insulin aspart  4 Units Subcutaneous TID WC  . insulin glargine  12 Units Subcutaneous BID  . linagliptin  5 mg Oral Daily  . zinc sulfate  220 mg Oral Daily   Continuous Infusions: . remdesivir 100 mg in NS 100 mL 100 mg (03/02/19 1052)     LOS: 3 days   Cherene Altes, MD [facility] Office  [phone] Pager - Text Page per Shea Evans  If 7PM-7AM, please contact night-coverage per Amion 03/02/2019, 3:26 PM

## 2019-03-02 NOTE — TOC Progression Note (Signed)
Received consult stating pt will likely dc 1/16 with new Rx for Eliquis. 30 day coupon printed to the unit and message sent to charge RN requesting assistance providing to pt.   PT has indicated no PT f/u needed.  Weekend TOC will be available if dc needs arise.

## 2019-03-02 NOTE — Progress Notes (Signed)
Occupational Therapy Evaluation Patient Details Name: Randy Baker MRN: [id number] DOB: [birth year] Today's Date: 03/02/2019    History of Present Illness Pt with acute hypoxic respiratory failure due to covid. Pt developed RLE DVT. PMH - HTN, DM   Clinical Impression   Patient is very pleasant and willing to participate in therapy.  He is independent at prior level and lives alone.  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  He was on room air throughout session.  At rest his SpO2 was 99-100 and with mobility it briefly dropped to 94 and quickly recovered back to 99 in less than a minute.  He felt no shortness of breath or lightheadedness. He was independent with all ADLs.  Educated him on pursed lip breathing and practiced the incentive spirometer and flutter valve.  Completed theraband exercises.  Patient does not need further OT services and is independent to return home.    Follow Up Recommendations  No OT follow up    Equipment Recommendations  None recommended by OT    Recommendations for Other Services       Precautions / Restrictions Precautions Precautions: None Restrictions Weight Bearing Restrictions: No      Mobility Bed Mobility Overal bed mobility: Independent                Transfers Overall transfer level: Independent Equipment used: None                  Balance Overall balance assessment: No apparent balance deficits (not formally assessed)                                         ADL either performed or assessed with clinical judgement   ADL Overall ADL's : Independent                                             Vision Baseline Vision/History: Wears glasses       Perception     Praxis      Pertinent Vitals/Pain Pain Assessment: No/denies pain     Hand Dominance     Extremity/Trunk Assessment Upper Extremity Assessment Upper Extremity Assessment: Generalized weakness            Communication Communication Communication: No difficulties   Cognition Arousal/Alertness: Awake/alert Behavior During Therapy: WFL for tasks assessed/performed Overall Cognitive Status: Within Functional Limits for tasks assessed                                     General Comments       Exercises Exercises: General Upper Extremity;General Lower Extremity;Other exercises General Exercises - Upper Extremity Shoulder Flexion: AROM;10 reps;Theraband Theraband Level (Shoulder Flexion): Level 2 (Red) Shoulder ABduction: AROM;10 reps;Theraband Theraband Level (Shoulder Abduction): Level 2 (Red) Shoulder Horizontal ABduction: AROM;10 reps;Theraband Theraband Level (Shoulder Horizontal Abduction): Level 2 (Red) Other Exercises Other Exercises: x10 incentive spirometer Other Exercises: x10 flutter valve   Shoulder Instructions      Home Living Family/patient expects to be discharged to:: Private residence Living Arrangements: Alone   Type of Home: House Home Access: Stairs to enter [employer] of Steps: 3-4 Entrance Stairs-Rails: Right Home Layout: One level  Bathroom Shower/Tub: Tub only;Walk-in shower   Bathroom Toilet: Standard Bathroom Accessibility: Yes   Home Equipment: Shower seat - built in;Grab bars - toilet;Grab bars - tub/shower          Prior Functioning/Environment Level of Independence: Independent        Comments: works as Librarian, academic Problem List: Decreased strength;Decreased activity tolerance      OT Treatment/Interventions:      OT Goals(Current goals can be found in the care plan section) Acute Rehab OT Goals Patient Stated Goal: Go back home and to work OT Goal Formulation: With patient Time For Goal Achievement: 03/16/19 Potential to Achieve Goals: Good  OT Frequency:     Barriers to D/C:            Co-evaluation              AM-PAC OT "6 Clicks" Daily Activity      Outcome Measure Help from another person eating meals?: None Help from another person taking care of personal grooming?: None Help from another person toileting, which includes using toliet, bedpan, or urinal?: None Help from another person bathing (including washing, rinsing, drying)?: None Help from another person to put on and taking off regular upper body clothing?: None Help from another person to put on and taking off regular lower body clothing?: None 6 Click Score: 24   End of Session Nurse Communication: Mobility status  Activity Tolerance: Patient tolerated treatment well Patient left: in chair;with call bell/phone within reach  OT Visit Diagnosis: Muscle weakness (generalized) (M62.81)                Time: [id number] OT Time Calculation (min): 25 min Charges:  OT General Charges $OT Visit: 1 Visit OT Evaluation $OT Eval Moderate Complexity: 1 Mod OT Treatments $Therapeutic Activity: 8-22 mins  August Luz, OTR/L   Phylliss Bob 03/02/2019, 3:12 PM

## 2019-03-02 NOTE — Discharge Instructions (Addendum)
Date of Positive COVID Test: 02/27/19  Date Quarantine Ends: 03/20/19  Your treating doctor was: Joette Catching, MD [facility]  [phone]  COVID-19 COVID-19 is a respiratory infection that is caused by a virus called severe acute respiratory syndrome coronavirus 2 (SARS-CoV-2). The disease is also known as coronavirus disease or novel coronavirus. In some people, the virus may not cause any symptoms. In others, it may cause a serious infection. The infection can get worse quickly and can lead to complications, such as:  Pneumonia, or infection of the lungs.  Acute respiratory distress syndrome or ARDS. This is a condition in which fluid build-up in the lungs prevents the lungs from filling with air and passing oxygen into the blood.  Acute respiratory failure. This is a condition in which there is not enough oxygen passing from the lungs to the body or when carbon dioxide is not passing from the lungs out of the body.  Sepsis or septic shock. This is a serious bodily reaction to an infection.  Blood clotting problems.  Secondary infections due to bacteria or fungus.  Organ failure. This is when your body's organs stop working. The virus that causes COVID-19 is contagious. This means that it can spread from person to person through droplets from coughs and sneezes (respiratory secretions). What are the causes? This illness is caused by a virus. You may catch the virus by:  Breathing in droplets from an infected person. Droplets can be spread by a person breathing, speaking, singing, coughing, or sneezing.  Touching something, like a table or a doorknob, that was exposed to the virus (contaminated) and then touching your mouth, nose, or eyes. What increases the risk? Risk for infection You are more likely to be infected with this virus if you:  Are within 6 feet (2 meters) of a person with COVID-19.  Provide care for or live with a person who is infected with  COVID-19.  Spend time in crowded indoor spaces or live in shared housing. Risk for serious illness You are more likely to become seriously ill from the virus if you:  Are 5 years of age or older. The higher your age, the more you are at risk for serious illness.  Live in a nursing home or long-term care facility.  Have cancer.  Have a long-term (chronic) disease such as: ? Chronic lung disease, including chronic obstructive pulmonary disease or asthma. ? A long-term disease that lowers your body's ability to fight infection (immunocompromised). ? Heart disease, including heart failure, a condition in which the arteries that lead to the heart become narrow or blocked (coronary artery disease), a disease which makes the heart muscle thick, weak, or stiff (cardiomyopathy). ? Diabetes. ? Chronic kidney disease. ? Sickle cell disease, a condition in which red blood cells have an abnormal "sickle" shape. ? Liver disease.  Are obese. What are the signs or symptoms? Symptoms of this condition can range from mild to severe. Symptoms may appear any time from 2 to 14 days after being exposed to the virus. They include:  A fever or chills.  A cough.  Difficulty breathing.  Headaches, body aches, or muscle aches.  Runny or stuffy (congested) nose.  A sore throat.  New loss of taste or smell. Some people may also have stomach problems, such as nausea, vomiting, or diarrhea. Other people may not have any symptoms of COVID-19. How is this diagnosed? This condition may be diagnosed based on:  Your signs and symptoms, especially if: ? You  live in an area with a COVID-19 outbreak. ? You recently traveled to or from an area where the virus is common. ? You provide care for or live with a person who was diagnosed with COVID-19. ? You were exposed to a person who was diagnosed with COVID-19.  A physical exam.  Lab tests, which may include: ? Taking a sample of fluid from the back of  your nose and throat (nasopharyngeal fluid), your nose, or your throat using a swab. ? A sample of mucus from your lungs (sputum). ? Blood tests.  Imaging tests, which may include, X-rays, CT scan, or ultrasound. How is this treated? At present, there is no medicine to treat COVID-19. Medicines that treat other diseases are being used on a trial basis to see if they are effective against COVID-19. Your health care provider will talk with you about ways to treat your symptoms. For most people, the infection is mild and can be managed at home with rest, fluids, and over-the-counter medicines. Treatment for a serious infection usually takes places in a hospital intensive care unit (ICU). It may include one or more of the following treatments. These treatments are given until your symptoms improve.  Receiving fluids and medicines through an IV.  Supplemental oxygen. Extra oxygen is given through a tube in the nose, a face mask, or a hood.  Positioning you to lie on your stomach (prone position). This makes it easier for oxygen to get into the lungs.  Continuous positive airway pressure (CPAP) or bi-level positive airway pressure (BPAP) machine. This treatment uses mild air pressure to keep the airways open. A tube that is connected to a motor delivers oxygen to the body.  Ventilator. This treatment moves air into and out of the lungs by using a tube that is placed in your windpipe.  Tracheostomy. This is a procedure to create a hole in the neck so that a breathing tube can be inserted.  Extracorporeal membrane oxygenation (ECMO). This procedure gives the lungs a chance to recover by taking over the functions of the heart and lungs. It supplies oxygen to the body and removes carbon dioxide. Follow these instructions at home: Lifestyle  If you are sick, stay home except to get medical care. Your health care provider will tell you how long to stay home. Call your health care provider before you go  for medical care.  Rest at home as told by your health care provider.  Do not use any products that contain nicotine or tobacco, such as cigarettes, e-cigarettes, and chewing tobacco. If you need help quitting, ask your health care provider.  Return to your normal activities as told by your health care provider. Ask your health care provider what activities are safe for you. General instructions  Take over-the-counter and prescription medicines only as told by your health care provider.  Drink enough fluid to keep your urine pale yellow.  Keep all follow-up visits as told by your health care provider. This is important. How is this prevented?  There is no vaccine to help prevent COVID-19 infection. However, there are steps you can take to protect yourself and others from this virus. To protect yourself:   Do not travel to areas where COVID-19 is a risk. The areas where COVID-19 is reported change often. To identify high-risk areas and travel restrictions, check the CDC travel website: FatFares.com.br  If you live in, or must travel to, an area where COVID-19 is a risk, take precautions to avoid infection. ?  Stay away from people who are sick. ? Wash your hands often with soap and water for 20 seconds. If soap and water are not available, use an alcohol-based hand sanitizer. ? Avoid touching your mouth, face, eyes, or nose. ? Avoid going out in public, follow guidance from your state and local health authorities. ? If you must go out in public, wear a cloth face covering or face mask. Make sure your mask covers your nose and mouth. ? Avoid crowded indoor spaces. Stay at least 6 feet (2 meters) away from others. ? Disinfect objects and surfaces that are frequently touched every day. This may include:  Counters and tables.  Doorknobs and light switches.  Sinks and faucets.  Electronics, such as phones, remote controls, keyboards, computers, and tablets. To protect  others: If you have symptoms of COVID-19, take steps to prevent the virus from spreading to others.  If you think you have a COVID-19 infection, contact your health care provider right away. Tell your health care team that you think you may have a COVID-19 infection.  Stay home. Leave your house only to seek medical care. Do not use public transport.  Do not travel while you are sick.  Wash your hands often with soap and water for 20 seconds. If soap and water are not available, use alcohol-based hand sanitizer.  Stay away from other members of your household. Let healthy household members care for children and pets, if possible. If you have to care for children or pets, wash your hands often and wear a mask. If possible, stay in your own room, separate from others. Use a different bathroom.  Make sure that all people in your household wash their hands well and often.  Cough or sneeze into a tissue or your sleeve or elbow. Do not cough or sneeze into your hand or into the air.  Wear a cloth face covering or face mask. Make sure your mask covers your nose and mouth. Where to find more information  Centers for Disease Control and Prevention: PurpleGadgets.be  World Health Organization: https://www.castaneda.info/ Contact a health care provider if:  You live in or have traveled to an area where COVID-19 is a risk and you have symptoms of the infection.  You have had contact with someone who has COVID-19 and you have symptoms of the infection. Get help right away if:  You have trouble breathing.  You have pain or pressure in your chest.  You have confusion.  You have bluish lips and fingernails.  You have difficulty waking from sleep.  You have symptoms that get worse. These symptoms may represent a serious problem that is an emergency. Do not wait to see if the symptoms will go away. Get medical help right away. Call your local emergency  services (911 in the [place] Do not drive yourself to the hospital. Let the emergency medical personnel know if you think you have COVID-19. Summary  COVID-19 is a respiratory infection that is caused by a virus. It is also known as coronavirus disease or novel coronavirus. It can cause serious infections, such as pneumonia, acute respiratory distress syndrome, acute respiratory failure, or sepsis.  The virus that causes COVID-19 is contagious. This means that it can spread from person to person through droplets from breathing, speaking, singing, coughing, or sneezing.  You are more likely to develop a serious illness if you are 67 years of age or older, have a weak immune system, live in a nursing home, or have chronic  disease.  There is no medicine to treat COVID-19. Your health care provider will talk with you about ways to treat your symptoms.  Take steps to protect yourself and others from infection. Wash your hands often and disinfect objects and surfaces that are frequently touched every day. Stay away from people who are sick and wear a mask if you are sick. This information is not intended to replace advice given to you by your health care provider. Make sure you discuss any questions you have with your health care provider. Document Revised: 12/01/2018 Document Reviewed: 03/09/2018 Elsevier Patient Education  2020 [place].   COVID-19: How to Protect Yourself and Others Know how it spreads  There is currently no vaccine to prevent coronavirus disease 2019 (COVID-19).  The best way to prevent illness is to avoid being exposed to this virus.  The virus is thought to spread mainly from person-to-person. ? Between people who are in close contact with one another (within about 6 feet). ? Through respiratory droplets produced when an infected person coughs, sneezes or talks. ? These droplets can land in the mouths or noses of people who are nearby or possibly be inhaled into the  lungs. ? COVID-19 may be spread by people who are not showing symptoms. Everyone should Clean your hands often  Wash your hands often with soap and water for at least 20 seconds especially after you have been in a public place, or after blowing your nose, coughing, or sneezing.  If soap and water are not readily available, use a hand sanitizer that contains at least 60% alcohol. Cover all surfaces of your hands and rub them together until they feel dry.  Avoid touching your eyes, nose, and mouth with unwashed hands. Avoid close contact  Limit contact with others as much as possible.  Avoid close contact with people who are sick.  Put distance between yourself and other people. ? Remember that some people without symptoms may be able to spread virus. ? This is especially important for people who are at higher risk of getting very GainPain.com.cy Cover your mouth and nose with a mask when around others  You could spread COVID-19 to others even if you do not feel sick.  Everyone should wear a mask in public settings and when around people not living in their household, especially when social distancing is difficult to maintain. ? Masks should not be placed on young children under age 100, anyone who has trouble breathing, or is unconscious, incapacitated or otherwise unable to remove the mask without assistance.  The mask is meant to protect other people in case you are infected.  Do NOT use a facemask meant for a Dietitian.  Continue to keep about 6 feet between yourself and others. The mask is not a substitute for social distancing. Cover coughs and sneezes  Always cover your mouth and nose with a tissue when you cough or sneeze or use the inside of your elbow.  Throw used tissues in the trash.  Immediately wash your hands with soap and water for at least 20 seconds. If soap and water are not readily  available, clean your hands with a hand sanitizer that contains at least 60% alcohol. Clean and disinfect  Clean AND disinfect frequently touched surfaces daily. This includes tables, doorknobs, light switches, countertops, handles, desks, phones, keyboards, toilets, faucets, and sinks. RackRewards.fr  If surfaces are dirty, clean them: Use detergent or soap and water prior to disinfection.  Then, use a household  disinfectant. You can see a list of EPA-registered household disinfectants here. michellinders.com 10/18/2018 This information is not intended to replace advice given to you by your health care provider. Make sure you discuss any questions you have with your health care provider. Document Revised: 10/26/2018 Document Reviewed: 08/24/2018 Elsevier Patient Education  [place].    COVID-19 Frequently Asked Questions COVID-19 (coronavirus disease) is an infection that is caused by a large family of viruses. Some viruses cause illness in people and others cause illness in animals like camels, cats, and bats. In some cases, the viruses that cause illness in animals can spread to humans. Where did the coronavirus come from? In December 2019, [place] told the [facility]) of several cases of lung disease (human respiratory illness). These cases were linked to an open seafood and livestock market in the city of [place]. The link to the seafood and livestock market suggests that the virus may have spread from animals to humans. However, since that first outbreak in December, the virus has also been shown to spread from person to person. What is the name of the disease and the virus? Disease name Early on, this disease was called novel coronavirus. This is because scientists determined that the disease was caused by a new (novel) respiratory virus. The World Health Organization [facility]) has now named the  disease COVID-19, or coronavirus disease. Virus name The virus that causes the disease is called severe acute respiratory syndrome coronavirus 2 (SARS-CoV-2). More information on disease and virus naming World Health Organization [facility]): www.who.int/emergencies/diseases/novel-coronavirus-2019/technical-guidance/naming-the-coronavirus-disease-(covid-2019)-and-the-virus-that-causes-it Who is at risk for complications from coronavirus disease? Some people may be at higher risk for complications from coronavirus disease. This includes older adults and people who have chronic diseases, such as heart disease, diabetes, and lung disease. If you are at higher risk for complications, take these extra precautions:  Stay home as much as possible.  Avoid social gatherings and travel.  Avoid close contact with others. Stay at least 6 ft (2 m) away from others, if possible.  Wash your hands often with soap and water for at least 20 seconds.  Avoid touching your face, mouth, nose, or eyes.  Keep supplies on hand at home, such as food, medicine, and cleaning supplies.  If you must go out in public, wear a cloth face covering or face mask. Make sure your mask covers your nose and mouth. How does coronavirus disease spread? The virus that causes coronavirus disease spreads easily from person to person (is contagious). You may catch the virus by:  Breathing in droplets from an infected person. Droplets can be spread by a person breathing, speaking, singing, coughing, or sneezing.  Touching something, like a table or a doorknob, that was exposed to the virus (contaminated) and then touching your mouth, nose, or eyes. Can I get the virus from touching surfaces or objects? There is still a lot that we do not know about the virus that causes coronavirus disease. Scientists are basing a lot of information on what they know about similar viruses, such as:  Viruses cannot generally survive on surfaces for long.  They need a human body (host) to survive.  It is more likely that the virus is spread by close contact with people who are sick (direct contact), such as through: ? Shaking hands or hugging. ? Breathing in respiratory droplets that travel through the air. Droplets can be spread by a person breathing, speaking, singing, coughing, or sneezing.  It is less likely that the  virus is spread when a person touches a surface or object that has the virus on it (indirect contact). The virus may be able to enter the body if the person touches a surface or object and then touches his or her face, eyes, nose, or mouth. Can a person spread the virus without having symptoms of the disease? It may be possible for the virus to spread before a person has symptoms of the disease, but this is most likely not the main way the virus is spreading. It is more likely for the virus to spread by being in close contact with people who are sick and breathing in the respiratory droplets spread by a person breathing, speaking, singing, coughing, or sneezing. What are the symptoms of coronavirus disease? Symptoms vary from person to person and can range from mild to severe. Symptoms may include:  Fever or chills.  Cough.  Difficulty breathing or feeling short of breath.  Headaches, body aches, or muscle aches.  Runny or stuffy (congested) nose.  Sore throat.  New loss of taste or smell.  Nausea, vomiting, or diarrhea. These symptoms can appear anywhere from 2 to 14 days after you have been exposed to the virus. Some people may not have any symptoms. If you develop symptoms, call your health care provider. People with severe symptoms may need hospital care. Should I be tested for this virus? Your health care provider will decide whether to test you based on your symptoms, history of exposure, and your risk factors. How does a health care provider test for this virus? Health care providers will collect samples to send  for testing. Samples may include:  Taking a swab of fluid from the back of your nose and throat, your nose, or your throat.  Taking fluid from the lungs by having you cough up mucus (sputum) into a sterile cup.  Taking a blood sample. Is there a treatment or vaccine for this virus? Currently, there is no vaccine to prevent coronavirus disease. Also, there are no medicines like antibiotics or antivirals to treat the virus. A person who becomes sick is given supportive care, which means rest and fluids. A person may also relieve his or her symptoms by using over-the-counter medicines that treat sneezing, coughing, and runny nose. These are the same medicines that a person takes for the common cold. If you develop symptoms, call your health care provider. People with severe symptoms may need hospital care. What can I do to protect myself and my family from this virus?     You can protect yourself and your family by taking the same actions that you would take to prevent the spread of other viruses. Take the following actions:  Wash your hands often with soap and water for at least 20 seconds. If soap and water are not available, use alcohol-based hand sanitizer.  Avoid touching your face, mouth, nose, or eyes.  Cough or sneeze into a tissue, sleeve, or elbow. Do not cough or sneeze into your hand or the air. ? If you cough or sneeze into a tissue, throw it away immediately and wash your hands.  Disinfect objects and surfaces that you frequently touch every day.  Stay away from people who are sick.  Avoid going out in public, follow guidance from your state and local health authorities.  Avoid crowded indoor spaces. Stay at least 6 ft (2 m) away from others.  If you must go out in public, wear a cloth face covering or face mask.  Make sure your mask covers your nose and mouth.  Stay home if you are sick, except to get medical care. Call your health care provider before you get medical  care. Your health care provider will tell you how long to stay home.  Make sure your vaccines are up to date. Ask your health care provider what vaccines you need. What should I do if I need to travel? Follow travel recommendations from your local health authority, the CDC, and WHO. Travel information and advice  Centers for Disease Control and Prevention (CDC): BodyEditor.hu  World Health Organization [facility]): ThirdIncome.ca Know the risks and take action to protect your health  You are at higher risk of getting coronavirus disease if you are traveling to areas with an outbreak or if you are exposed to travelers from areas with an outbreak.  Wash your hands often and practice good hygiene to lower the risk of catching or spreading the virus. What should I do if I am sick? General instructions to stop the spread of infection  Wash your hands often with soap and water for at least 20 seconds. If soap and water are not available, use alcohol-based hand sanitizer.  Cough or sneeze into a tissue, sleeve, or elbow. Do not cough or sneeze into your hand or the air.  If you cough or sneeze into a tissue, throw it away immediately and wash your hands.  Stay home unless you must get medical care. Call your health care provider or local health authority before you get medical care.  Avoid public areas. Do not take public transportation, if possible.  If you can, wear a mask if you must go out of the house or if you are in close contact with someone who is not sick. Make sure your mask covers your nose and mouth. Keep your home clean  Disinfect objects and surfaces that are frequently touched every day. This may include: ? Counters and tables. ? Doorknobs and light switches. ? Sinks and faucets. ? Electronics such as phones, remote controls, keyboards, computers, and tablets.  Wash dishes in hot,  soapy water or use a dishwasher. Air-dry your dishes.  Wash laundry in hot water. Prevent infecting other household members  Let healthy household members care for children and pets, if possible. If you have to care for children or pets, wash your hands often and wear a mask.  Sleep in a different bedroom or bed, if possible.  Do not share personal items, such as razors, toothbrushes, deodorant, combs, brushes, towels, and washcloths. Where to find more information Centers for Disease Control and Prevention (CDC)  Information and news updates: https://www.butler-gonzalez.com/ World Health Organization [facility])  Information and news updates: MissExecutive.com.ee  Coronavirus health topic: https://www.castaneda.info/  Questions and answers on COVID-19: OpportunityDebt.at  Global tracker: who.sprinklr.com American Academy of Pediatrics (AAP)  Information for families: www.healthychildren.org/English/health-issues/conditions/chest-lungs/Pages/2019-Novel-Coronavirus.aspx The coronavirus situation is changing rapidly. Check your local health authority website or the CDC and [facility] websites for updates and news. When should I contact a health care provider?  Contact your health care provider if you have symptoms of an infection, such as fever or cough, and you: ? Have been near anyone who is known to have coronavirus disease. ? Have come into contact with a person who is suspected to have coronavirus disease. ? Have traveled to an area where there is an outbreak of COVID-19. When should I get emergency medical care?  Get help right away by calling your local emergency services (911 in the  [place] if you have: ? Trouble breathing. ? Pain or pressure in your chest. ? Confusion. ? Blue-tinged lips and fingernails. ? Difficulty waking from sleep. ? Symptoms that get worse. Let the emergency medical personnel know if  you think you have coronavirus disease. Summary  A new respiratory virus is spreading from person to person and causing COVID-19 (coronavirus disease).  The virus that causes COVID-19 appears to spread easily. It spreads from one person to another through droplets from breathing, speaking, singing, coughing, or sneezing.  Older adults and those with chronic diseases are at higher risk of disease. If you are at higher risk for complications, take extra precautions.  There is currently no vaccine to prevent coronavirus disease. There are no medicines, such as antibiotics or antivirals, to treat the virus.  You can protect yourself and your family by washing your hands often, avoiding touching your face, and covering your coughs and sneezes. This information is not intended to replace advice given to you by your health care provider. Make sure you discuss any questions you have with your health care provider. Document Revised: 12/01/2018 Document Reviewed: 05/30/2018 Elsevier Patient Education  [place] on my medicine - ELIQUIS (apixaban)  This medication education was reviewed with me or my healthcare representative as part of my discharge preparation.  The pharmacist that spoke with me during my hospital stay was:  Emiliano Dyer, [facility]  Why was Eliquis prescribed for you? Eliquis was prescribed to treat blood clots that may have been found in the veins of your legs (deep vein thrombosis) or in your lungs (pulmonary embolism) and to reduce the risk of them occurring again.  What do You need to know about Eliquis ? The starting dose is 10 mg (two 5 mg tablets) taken TWICE daily for the FIRST SEVEN (7) DAYS, then on (enter date)  03/09/19  the dose is reduced to ONE 5 mg tablet taken TWICE daily.  Eliquis may be taken with or without food.   Try to take the dose about the same time in the morning and in the evening. If you have difficulty swallowing the tablet  whole please discuss with your pharmacist how to take the medication safely.  Take Eliquis exactly as prescribed and DO NOT stop taking Eliquis without talking to the doctor who prescribed the medication.  Stopping may increase your risk of developing a new blood clot.  Refill your prescription before you run out.  After discharge, you should have regular check-up appointments with your healthcare provider that is prescribing your Eliquis.    What do you do if you miss a dose? If a dose of ELIQUIS is not taken at the scheduled time, take it as soon as possible on the same day and twice-daily administration should be resumed. The dose should not be doubled to make up for a missed dose.  Important Safety Information A possible side effect of Eliquis is bleeding. You should call your healthcare provider right away if you experience any of the following: ? Bleeding from an injury or your nose that does not stop. ? Unusual colored urine (red or dark brown) or unusual colored stools (red or black). ? Unusual bruising for unknown reasons. ? A serious fall or if you hit your head (even if there is no bleeding).  Some medicines may interact with Eliquis and might increase your risk of bleeding or clotting while on Eliquis. To help avoid this, consult your healthcare provider or pharmacist  prior to using any new prescription or non-prescription medications, including herbals, vitamins, non-steroidal anti-inflammatory drugs (NSAIDs) and supplements.  This website has more information on Eliquis (apixaban): http://www.eliquis.com/eliquis/home

## 2019-03-02 NOTE — Evaluation (Signed)
Physical Therapy Evaluation Patient Details Name: Randy Baker MRN: [id number] DOB: 02-Oct-1950 Today's Date: 03/02/2019   History of Present Illness  Pt with acute hypoxic respiratory failure due to covid. Pt developed RLE DVT. PMH - HTN, DM  Clinical Impression  Pt doing well with mobility and no further PT needed.  Ready for dc from PT standpoint. Pt amb on RA with SpO2 >92% throughout.       Follow Up Recommendations No PT follow up    Equipment Recommendations  None recommended by PT    Recommendations for Other Services       Precautions / Restrictions Precautions Precautions: None      Mobility  Bed Mobility Overal bed mobility: Independent                Transfers Overall transfer level: Independent Equipment used: None                Ambulation/Gait Ambulation/Gait assistance: Independent Gait Distance (Feet): 350 Feet Assistive device: None Gait Pattern/deviations: WFL(Within Functional Limits) Gait velocity: normal Gait velocity interpretation: >4.37 ft/sec, indicative of normal walking speed General Gait Details: Steady gait on RA with SpO2 >92%  Stairs            Wheelchair Mobility    Modified Rankin (Stroke Patients Only)       Balance Overall balance assessment: No apparent balance deficits (not formally assessed)                                           Pertinent Vitals/Pain Pain Assessment: No/denies pain    Home Living Family/patient expects to be discharged to:: Private residence Living Arrangements: Alone   Type of Home: House Home Access: Stairs to enter Entrance Stairs-Rails: Right Entrance Stairs-Number of Steps: 3-4 Home Layout: One level Home Equipment: None      Prior Function Level of Independence: Independent         Comments: works as Teaching laboratory technician        Extremity/Trunk Assessment   Upper Extremity Assessment Upper Extremity  Assessment: Defer to OT evaluation    Lower Extremity Assessment Lower Extremity Assessment: Overall WFL for tasks assessed       Communication   Communication: No difficulties  Cognition Arousal/Alertness: Awake/alert Behavior During Therapy: WFL for tasks assessed/performed Overall Cognitive Status: Within Functional Limits for tasks assessed                                        General Comments      Exercises     Assessment/Plan    PT Assessment Patent does not need any further PT services  PT Problem List         PT Treatment Interventions      PT Goals (Current goals can be found in the Care Plan section)  Acute Rehab PT Goals PT Goal Formulation: All assessment and education complete, DC therapy    Frequency     Barriers to discharge        Co-evaluation               AM-PAC PT "6 Clicks" Mobility  Outcome Measure Help needed turning from your back to your side while in a flat bed without  using bedrails?: None Help needed moving from lying on your back to sitting on the side of a flat bed without using bedrails?: None Help needed moving to and from a bed to a chair (including a wheelchair)?: None Help needed standing up from a chair using your arms (e.g., wheelchair or bedside chair)?: None Help needed to walk in hospital room?: None Help needed climbing 3-5 steps with a railing? : None 6 Click Score: 24    End of Session   Activity Tolerance: Patient tolerated treatment well Patient left: in chair;with call bell/phone within reach   PT Visit Diagnosis: Other abnormalities of gait and mobility (R26.89)    Time: [id number] PT Time Calculation (min) (ACUTE ONLY): 28 min   Charges:   PT Evaluation $PT Eval Low Complexity: [place] Pager [phone] Office [place] 03/02/2019, 11:33 AM

## 2019-03-02 NOTE — Plan of Care (Signed)

## 2019-03-02 NOTE — Progress Notes (Signed)
ANTICOAGULATION CONSULT NOTE - Follow Up Consult  Pharmacy Consult for Lovenox Indication: DVT  No Known Allergies  Patient Measurements: Height: 5\' 11"  (180.3 cm) Weight: 216 lb (98 kg) IBW/kg (Calculated) : 75.3 Heparin Dosing Weight:  Vital Signs: Temp: 98.6 F (37 C) (01/15 0730) Temp Source: Oral (01/15 0730) BP: 152/99 (01/15 0730) Pulse Rate: 72 (01/15 0730)  Labs: Recent Labs    02/28/19 0534 02/28/19 0534 03/01/19 0254 03/02/19 0118  HGB 14.8   < > 14.2 13.6  HCT 45.9  --  43.7 41.4  PLT 299  --  314 357  CREATININE 0.92  --  0.83 0.78   < > = values in this interval not displayed.    Estimated Creatinine Clearance: 107 mL/min (by C-G formula based on SCr of 0.78 mg/dL).   Medications:  Scheduled:  . amLODipine  5 mg Oral Daily   And  . irbesartan  150 mg Oral Daily  . vitamin C  500 mg Oral Daily  . atorvastatin  20 mg Oral QHS  . dexamethasone (DECADRON) injection  6 mg Intravenous Q24H  . enoxaparin (LOVENOX) injection  1 mg/kg Subcutaneous Q12H  . insulin aspart  0-9 Units Subcutaneous TID WC  . insulin aspart  4 Units Subcutaneous TID WC  . insulin glargine  12 Units Subcutaneous BID  . linagliptin  5 mg Oral Daily  . zinc sulfate  220 mg Oral Daily   Infusions:  . remdesivir 100 mg in NS 100 mL 100 mg (03/02/19 1052)    Assessment: 66 yoM admitted on 1/12 with COVID-19.  He was found to have acute DVT on 1/14 and pharmacy is consulted to transition from prophylactic Lovenox to treatment dose Lovenox.  CBC and SCr are stable/wnl.  03/02/2019 transition to apixaban today   SCr 0.78  Hgb stable 13.6, Plts wnl  D-dimer down 10  No bleeding reported  No drug-drug interactions  Goal of Therapy:  Therapeutic anticoagulation Monitor platelets by anticoagulation protocol: Yes   Plan:  DC Lovenox Begin Apixaban 10mg  BID x 7 days, then 5mg  BID thereafter Monitor for signs/symptoms of bleeding Will provide DOAC education prior to  discharge  Peggyann Juba, PharmD, [place]: [phone] 03/02/2019 3:34 PM

## 2019-03-03 LAB — CBC WITH DIFFERENTIAL/PLATELET
Abs Immature Granulocytes: 0.13 10*3/uL — ABNORMAL HIGH (ref 0.00–0.07)
Basophils Absolute: 0 10*3/uL (ref 0.0–0.1)
Basophils Relative: 0 %
Eosinophils Absolute: 0 10*3/uL (ref 0.0–0.5)
Eosinophils Relative: 0 %
HCT: 43.2 % (ref 39.0–52.0)
Hemoglobin: 14.1 g/dL (ref 13.0–17.0)
Immature Granulocytes: 1 %
Lymphocytes Relative: 11 %
Lymphs Abs: 1 10*3/uL (ref 0.7–4.0)
MCH: 30.5 pg (ref 26.0–34.0)
MCHC: 32.6 g/dL (ref 30.0–36.0)
MCV: 93.5 fL (ref 80.0–100.0)
Monocytes Absolute: 0.5 10*3/uL (ref 0.1–1.0)
Monocytes Relative: 5 %
Neutro Abs: 7.8 10*3/uL — ABNORMAL HIGH (ref 1.7–7.7)
Neutrophils Relative %: 83 %
Platelets: 366 10*3/uL (ref 150–400)
RBC: 4.62 MIL/uL (ref 4.22–5.81)
RDW: 12.7 % (ref 11.5–15.5)
WBC: 9.4 10*3/uL (ref 4.0–10.5)
nRBC: 0 % (ref 0.0–0.2)

## 2019-03-03 LAB — GLUCOSE, CAPILLARY
Glucose-Capillary: 235 mg/dL — ABNORMAL HIGH (ref 70–99)
Glucose-Capillary: 195 mg/dL — ABNORMAL HIGH (ref 70–99)

## 2019-03-03 LAB — COMPREHENSIVE METABOLIC PANEL
ALT: 76 U/L — ABNORMAL HIGH (ref 0–44)
AST: 63 U/L — ABNORMAL HIGH (ref 15–41)
Albumin: 2.5 g/dL — ABNORMAL LOW (ref 3.5–5.0)
Alkaline Phosphatase: 78 U/L (ref 38–126)
Anion gap: 11 (ref 5–15)
BUN: 24 mg/dL — ABNORMAL HIGH (ref 8–23)
CO2: 20 mmol/L — ABNORMAL LOW (ref 22–32)
Calcium: 8.3 mg/dL — ABNORMAL LOW (ref 8.9–10.3)
Chloride: 103 mmol/L (ref 98–111)
Creatinine, Ser: 0.8 mg/dL (ref 0.61–1.24)
GFR calc Af Amer: >60 mL/min (ref >60)
GFR calc non Af Amer: >60 mL/min (ref >60)
Glucose, Bld: 206 mg/dL — ABNORMAL HIGH (ref 70–99)
Potassium: 4.8 mmol/L (ref 3.5–5.1)
Sodium: 134 mmol/L — ABNORMAL LOW (ref 135–145)
Total Bilirubin: 0.4 mg/dL (ref 0.3–1.2)
Total Protein: 6.4 g/dL — ABNORMAL LOW (ref 6.5–8.1)

## 2019-03-03 LAB — D-DIMER, QUANTITATIVE: D-Dimer, Quant: 6.14 ug/mL-FEU — ABNORMAL HIGH (ref 0.00–0.50)

## 2019-03-03 LAB — FERRITIN: Ferritin: 778 ng/mL — ABNORMAL HIGH (ref 24–336)

## 2019-03-03 LAB — C-REACTIVE PROTEIN: CRP: 2.8 mg/dL — ABNORMAL HIGH (ref <1.0)

## 2019-03-03 MED ORDER — APIXABAN 5 MG PO TABS: ORAL_TABLET | ORAL | 2 refills | Status: DC

## 2019-03-03 MED ORDER — DEXAMETHASONE 6 MG PO TABS: 6.0000 mg | ORAL_TABLET | Freq: Every day | ORAL | 0 refills | Status: AC

## 2019-03-03 MED ORDER — ACETAMINOPHEN 325 MG PO TABS: 650.0000 mg | ORAL_TABLET | Freq: Four times a day (QID) | ORAL | Status: DC | PRN

## 2019-03-03 NOTE — Discharge Summary (Addendum)
DISCHARGE SUMMARY  Randy Baker  MR#: [id number]  DOB:Apr 02, 1950  Date of Admission: 02/27/2019 Date of Discharge: 03/03/2019  Attending Physician:Chiffon Kittleson Hennie Duos, MD  Patient's [id number], Jenny Reichmann, MD  Consults: none   Disposition: D/C home   Date of Positive COVID Test: 02/27/19  Date Quarantine Ends: 03/20/19  COVID-19 specific Treatment: Decadron 1/12 > 1/21 Remdesivir 1/13 > 1/16  Follow-up Appts: Follow-up Information    Lavone Orn, MD Follow up in 1 week(s).   Specialty: Internal Medicine Contact information: [address] [place] [address] [phone]           Tests Needing Follow-up: -assess for tolerance of Eliquis -assess CBG control  -follow-up LFTs to assure have normalized  Discharge Diagnoses: COVID Pneumonia Acute hypoxic respiratory failure Acute DVT DM 2 HTN Transaminitis  Initial presentation: 69 year old with a history of DM and HTN who presented to the ED via EMS with severe shortness of breath.  He reported testing positive for Covid the preceding week, and discovered that his saturations were 85% on room air at home on the date of his presentation.  Upon arrival in the ED his saturations were 78% on room air.  CXR noted patchy multifocal opacities bilaterally.  Hospital Course:  COVID Pneumonia -acute hypoxic respiratory failure Remdesivir course discontinued after fourth dose due to rapid improvement and plan for discharge home -to complete 10-day course of Decadron with transition to oral dosing at time of discharge -  procalcitonin rapidly declined therefore abx stopped early in hospitalization -oxygen was able to be weaned to room air and patient tolerated this without difficulty -ambulated with PT/OT without difficulty -saturations remained 88% or greater even with physical exertion therefore home O2 was not required  DVT - Markedly elevated d-dimer  No PE per CTa - LE venous duplex + for peroneal DVT -  transitioned to Eliquis  from full dose Lovenox prior to discharge w/ plan for a 3 month course of tx -patient was counseled on precautions to be taken while using a "blood thinner"   Modestly elevated procalcitonin trended downward significantly and rapidly - discontinued antibiotic therapy  early- clinically stable  at time of discharge with no evidence of an active bacterial infection  DM 2 A1c 8.3 -CBG variable at time of discharge but not extremely high -resume usual diabetic regimen upon discharge home  HTN Blood pressure controlled at time of discharge  Transaminitis Likely due to Covid and/or remdesivir -remain very mildly elevated at time of discharge -follow-up in outpatient setting suggested as a precaution  Allergies as of 03/03/2019   No Known Allergies     Medication List    TAKE these medications   acetaminophen 325 MG tablet Commonly known as: TYLENOL Take 2 tablets (650 mg total) by mouth every 6 (six) hours as needed for mild pain or headache (fever >/= 101).   amLODipine-valsartan 5-160 MG tablet Commonly known as: EXFORGE Take 1 tablet by mouth daily.   apixaban 5 MG Tabs tablet Commonly known as: Eliquis Take 2 tablets (10mg ) twice daily for 7 days, then 1 tablet (5mg ) twice daily   atorvastatin 20 MG tablet Commonly known as: LIPITOR Take 20 mg by mouth daily.   dexamethasone 6 MG tablet Commonly known as: DECADRON Take 1 tablet (6 mg total) by mouth daily for 5 days. Start taking on: March 04, 2019   Synjardy 12.06-998 MG Tabs Generic drug: Empagliflozin-metFORMIN HCl Take 1 tablet by mouth 2 (two) times daily.   Toujeo Max  SoloStar 300 UNIT/ML Sopn Generic drug: Insulin Glargine (2 Unit Dial) Inject 12 Units into the skin daily.       Day of Discharge BP (!) 129/98 (BP Location: Left Arm)   Pulse 71   Temp 97.7 F (36.5 C) (Axillary)   Resp 20   Ht 5\' 11"  (1.803 m)   Wt 98 kg   SpO2 92%   BMI 30.13 kg/m   Physical  Exam: General: No acute respiratory distress Lungs: Clear to auscultation bilaterally without wheezes or crackles Cardiovascular: Regular rate and rhythm without murmur gallop or rub normal S1 and S2 Abdomen: Nontender, nondistended, soft, bowel sounds positive, no rebound, no ascites, no appreciable mass Extremities: No significant cyanosis, clubbing, or edema bilateral lower extremities  Basic Metabolic Panel: Recent Labs  Lab 02/27/19 1737 02/28/19 0534 03/01/19 0254 03/02/19 0118 03/03/19 0255  NA 137 139 137 137 134*  K 3.6 4.9 4.2 4.2 4.8  CL 102 104 105 105 103  CO2 23 25 22 22  20*  GLUCOSE 196* 148* 211* 156* 206*  BUN 16 18 24* 28* 24*  CREATININE 1.01 0.92 0.83 0.78 0.80  CALCIUM 8.5* 8.2* 8.1* 8.3* 8.3*    Liver Function Tests: Recent Labs  Lab 02/27/19 1737 02/28/19 0534 03/01/19 0254 03/02/19 0118 03/03/19 0255  AST 80* 66* 46* 38 63*  ALT 81* 68* 61* 58* 76*  ALKPHOS 82 80 69 67 78  BILITOT 0.4 1.2 0.7 0.3 0.4  PROT 6.9 6.7 6.7 6.2* 6.4*  ALBUMIN 2.5* 2.4* 2.5* 2.3* 2.5*    CBC: Recent Labs  Lab 02/27/19 1737 02/28/19 0534 03/01/19 0254 03/02/19 0118 03/03/19 0255  WBC 8.6 7.4 9.6 9.1 9.4  NEUTROABS 6.7 6.0 8.2* 7.7 7.8*  HGB 14.7 14.8 14.2 13.6 14.1  HCT 44.8 45.9 43.7 41.4 43.2  MCV 93.7 94.8 94.4 93.7 93.5  PLT 300 299 314 357 366    CBG: Recent Labs  Lab 03/02/19 1207 03/02/19 1627 03/02/19 2159 03/03/19 0711 03/03/19 1119  GLUCAP 157* 220* 153* 235* 195*    Recent Results (from the past 240 hour(s))  Blood Culture (routine x 2)     Status: None (Preliminary result)   Collection Time: 02/27/19  7:15 PM   Specimen: BLOOD  Result Value Ref Range Status   Specimen Description BLOOD RIGHT ANTECUBITAL  Final   Special Requests   Final    BOTTLES DRAWN AEROBIC AND ANAEROBIC Blood Culture adequate volume   Culture   Final    NO GROWTH 4 DAYS Performed at [facility], [place] [address], [place], [place] [address]    Report  Status PENDING  Incomplete  Blood Culture (routine x 2)     Status: None (Preliminary result)   Collection Time: 02/27/19  7:22 PM   Specimen: BLOOD  Result Value Ref Range Status   Specimen Description BLOOD LEFT ANTECUBITAL  Final   Special Requests   Final    BOTTLES DRAWN AEROBIC AND ANAEROBIC Blood Culture adequate volume   Culture   Final    NO GROWTH 4 DAYS Performed at [facility], [place] [address], [place],  [address]    Report Status PENDING  Incomplete  SARS CORONAVIRUS 2 (TAT 6-24 HRS) Nasopharyngeal Nasopharyngeal Swab     Status: Abnormal   Collection Time: 02/27/19 10:31 PM   Specimen: Nasopharyngeal Swab  Result Value Ref Range Status   SARS Coronavirus 2 POSITIVE (A) NEGATIVE Final    Comment: RESULT CALLED TO, READ BACK  BY AND VERIFIED WITH: RN  JOE, B. AT [phone] BY MESSAN H. ON 02/28/2019 (NOTE) SARS-CoV-2 target nucleic acids are DETECTED. The SARS-CoV-2 RNA is generally detectable in upper and lower respiratory specimens during the acute phase of infection. Positive results are indicative of the presence of SARS-CoV-2 RNA. Clinical correlation with patient history and other diagnostic information is  necessary to determine patient infection status. Positive results do not rule out bacterial infection or co-infection with other viruses.  The expected result is Negative. Fact Sheet for Patients: SugarRoll.be Fact Sheet for Healthcare Providers: https://www.woods-mathews.com/ This test is not yet approved or cleared by the [place] FDA and  has been authorized for detection and/or diagnosis of SARS-CoV-2 by FDA under an Emergency Use Authorization (EUA). This EUA will remain  in effect (meaning this test can be use d) for the duration of the COVID-19 declaration under Section 564(b)(1) of the Act, 21 U.S.C. section 360bbb-3(b)(1), unless the authorization is terminated or revoked sooner. Performed at [facility], [place] [address] [place], [place] [address]       Time spent in discharge (includes decision making & examination of pt): >35 minutes  03/03/2019, 1:52 PM   Cherene Altes, MD [facility] Office  [phone]

## 2019-03-04 LAB — CULTURE, BLOOD (ROUTINE X 2)
Culture: NO GROWTH
Culture: NO GROWTH
Special Requests: ADEQUATE
Special Requests: ADEQUATE

## 2019-03-26 DIAGNOSIS — E1136 Type 2 diabetes mellitus with diabetic cataract: Secondary | ICD-10-CM | POA: Diagnosis not present

## 2019-03-26 DIAGNOSIS — H524 Presbyopia: Secondary | ICD-10-CM | POA: Diagnosis not present

## 2019-03-26 DIAGNOSIS — H2513 Age-related nuclear cataract, bilateral: Secondary | ICD-10-CM | POA: Diagnosis not present

## 2019-03-26 DIAGNOSIS — Z794 Long term (current) use of insulin: Secondary | ICD-10-CM | POA: Diagnosis not present

## 2019-07-05 DIAGNOSIS — E7800 Pure hypercholesterolemia, unspecified: Secondary | ICD-10-CM | POA: Diagnosis not present

## 2019-07-05 DIAGNOSIS — Z1389 Encounter for screening for other disorder: Secondary | ICD-10-CM | POA: Diagnosis not present

## 2019-07-05 DIAGNOSIS — Z0000 Encounter for general adult medical examination without abnormal findings: Secondary | ICD-10-CM | POA: Diagnosis not present

## 2019-07-05 DIAGNOSIS — Z7984 Long term (current) use of oral hypoglycemic drugs: Secondary | ICD-10-CM | POA: Diagnosis not present

## 2019-07-05 DIAGNOSIS — Z86010 Personal history of colonic polyps: Secondary | ICD-10-CM | POA: Diagnosis not present

## 2019-07-05 DIAGNOSIS — N529 Male erectile dysfunction, unspecified: Secondary | ICD-10-CM | POA: Diagnosis not present

## 2019-07-05 DIAGNOSIS — E1169 Type 2 diabetes mellitus with other specified complication: Secondary | ICD-10-CM | POA: Diagnosis not present

## 2019-07-05 DIAGNOSIS — I10 Essential (primary) hypertension: Secondary | ICD-10-CM | POA: Diagnosis not present

## 2019-08-14 DIAGNOSIS — Z86010 Personal history of colonic polyps: Secondary | ICD-10-CM | POA: Diagnosis not present

## 2019-10-15 DIAGNOSIS — E1169 Type 2 diabetes mellitus with other specified complication: Secondary | ICD-10-CM | POA: Diagnosis not present

## 2019-10-15 DIAGNOSIS — R252 Cramp and spasm: Secondary | ICD-10-CM | POA: Diagnosis not present

## 2019-10-15 DIAGNOSIS — I10 Essential (primary) hypertension: Secondary | ICD-10-CM | POA: Diagnosis not present

## 2019-10-16 DIAGNOSIS — E1169 Type 2 diabetes mellitus with other specified complication: Secondary | ICD-10-CM | POA: Diagnosis not present

## 2019-10-16 DIAGNOSIS — E7800 Pure hypercholesterolemia, unspecified: Secondary | ICD-10-CM | POA: Diagnosis not present

## 2019-10-16 DIAGNOSIS — I10 Essential (primary) hypertension: Secondary | ICD-10-CM | POA: Diagnosis not present

## 2019-10-16 DIAGNOSIS — N401 Enlarged prostate with lower urinary tract symptoms: Secondary | ICD-10-CM | POA: Diagnosis not present

## 2019-10-16 DIAGNOSIS — E1165 Type 2 diabetes mellitus with hyperglycemia: Secondary | ICD-10-CM | POA: Diagnosis not present

## 2019-10-24 DIAGNOSIS — Z1159 Encounter for screening for other viral diseases: Secondary | ICD-10-CM | POA: Diagnosis not present

## 2019-10-25 DIAGNOSIS — K5730 Diverticulosis of large intestine without perforation or abscess without bleeding: Secondary | ICD-10-CM | POA: Diagnosis not present

## 2019-10-25 DIAGNOSIS — D122 Benign neoplasm of ascending colon: Secondary | ICD-10-CM | POA: Diagnosis not present

## 2019-10-25 DIAGNOSIS — K640 First degree hemorrhoids: Secondary | ICD-10-CM | POA: Diagnosis not present

## 2019-10-25 DIAGNOSIS — Z86010 Personal history of colonic polyps: Secondary | ICD-10-CM | POA: Diagnosis not present

## 2019-10-30 DIAGNOSIS — D122 Benign neoplasm of ascending colon: Secondary | ICD-10-CM | POA: Diagnosis not present

## 2019-11-15 DIAGNOSIS — E1165 Type 2 diabetes mellitus with hyperglycemia: Secondary | ICD-10-CM | POA: Diagnosis not present

## 2019-11-15 DIAGNOSIS — E1169 Type 2 diabetes mellitus with other specified complication: Secondary | ICD-10-CM | POA: Diagnosis not present

## 2019-11-15 DIAGNOSIS — E7800 Pure hypercholesterolemia, unspecified: Secondary | ICD-10-CM | POA: Diagnosis not present

## 2019-11-15 DIAGNOSIS — N401 Enlarged prostate with lower urinary tract symptoms: Secondary | ICD-10-CM | POA: Diagnosis not present

## 2019-11-15 DIAGNOSIS — I10 Essential (primary) hypertension: Secondary | ICD-10-CM | POA: Diagnosis not present

## 2019-12-10 DIAGNOSIS — M25512 Pain in left shoulder: Secondary | ICD-10-CM | POA: Diagnosis not present

## 2019-12-14 DIAGNOSIS — E1169 Type 2 diabetes mellitus with other specified complication: Secondary | ICD-10-CM | POA: Diagnosis not present

## 2019-12-14 DIAGNOSIS — E7800 Pure hypercholesterolemia, unspecified: Secondary | ICD-10-CM | POA: Diagnosis not present

## 2019-12-14 DIAGNOSIS — I10 Essential (primary) hypertension: Secondary | ICD-10-CM | POA: Diagnosis not present

## 2020-01-07 DIAGNOSIS — E7800 Pure hypercholesterolemia, unspecified: Secondary | ICD-10-CM | POA: Diagnosis not present

## 2020-01-07 DIAGNOSIS — N401 Enlarged prostate with lower urinary tract symptoms: Secondary | ICD-10-CM | POA: Diagnosis not present

## 2020-01-07 DIAGNOSIS — I10 Essential (primary) hypertension: Secondary | ICD-10-CM | POA: Diagnosis not present

## 2020-01-07 DIAGNOSIS — E1169 Type 2 diabetes mellitus with other specified complication: Secondary | ICD-10-CM | POA: Diagnosis not present

## 2020-01-07 DIAGNOSIS — E1165 Type 2 diabetes mellitus with hyperglycemia: Secondary | ICD-10-CM | POA: Diagnosis not present

## 2020-01-16 DIAGNOSIS — E1165 Type 2 diabetes mellitus with hyperglycemia: Secondary | ICD-10-CM | POA: Diagnosis not present

## 2020-01-16 DIAGNOSIS — E1169 Type 2 diabetes mellitus with other specified complication: Secondary | ICD-10-CM | POA: Diagnosis not present

## 2020-01-16 DIAGNOSIS — E7800 Pure hypercholesterolemia, unspecified: Secondary | ICD-10-CM | POA: Diagnosis not present

## 2020-01-16 DIAGNOSIS — I10 Essential (primary) hypertension: Secondary | ICD-10-CM | POA: Diagnosis not present

## 2020-01-16 DIAGNOSIS — N401 Enlarged prostate with lower urinary tract symptoms: Secondary | ICD-10-CM | POA: Diagnosis not present

## 2020-02-28 DIAGNOSIS — Z23 Encounter for immunization: Secondary | ICD-10-CM | POA: Diagnosis not present

## 2020-02-28 DIAGNOSIS — I10 Essential (primary) hypertension: Secondary | ICD-10-CM | POA: Diagnosis not present

## 2020-02-28 DIAGNOSIS — G4700 Insomnia, unspecified: Secondary | ICD-10-CM | POA: Diagnosis not present

## 2020-02-28 DIAGNOSIS — E1169 Type 2 diabetes mellitus with other specified complication: Secondary | ICD-10-CM | POA: Diagnosis not present

## 2020-05-02 DIAGNOSIS — E7800 Pure hypercholesterolemia, unspecified: Secondary | ICD-10-CM | POA: Diagnosis not present

## 2020-05-02 DIAGNOSIS — E1165 Type 2 diabetes mellitus with hyperglycemia: Secondary | ICD-10-CM | POA: Diagnosis not present

## 2020-05-02 DIAGNOSIS — G4700 Insomnia, unspecified: Secondary | ICD-10-CM | POA: Diagnosis not present

## 2020-05-02 DIAGNOSIS — I10 Essential (primary) hypertension: Secondary | ICD-10-CM | POA: Diagnosis not present

## 2020-05-02 DIAGNOSIS — E1169 Type 2 diabetes mellitus with other specified complication: Secondary | ICD-10-CM | POA: Diagnosis not present

## 2020-05-02 DIAGNOSIS — N401 Enlarged prostate with lower urinary tract symptoms: Secondary | ICD-10-CM | POA: Diagnosis not present

## 2020-05-02 DIAGNOSIS — F321 Major depressive disorder, single episode, moderate: Secondary | ICD-10-CM | POA: Diagnosis not present

## 2020-06-04 DIAGNOSIS — F324 Major depressive disorder, single episode, in partial remission: Secondary | ICD-10-CM | POA: Diagnosis not present

## 2020-07-17 DIAGNOSIS — F325 Major depressive disorder, single episode, in full remission: Secondary | ICD-10-CM | POA: Diagnosis not present

## 2020-07-17 DIAGNOSIS — E7800 Pure hypercholesterolemia, unspecified: Secondary | ICD-10-CM | POA: Diagnosis not present

## 2020-07-17 DIAGNOSIS — E1169 Type 2 diabetes mellitus with other specified complication: Secondary | ICD-10-CM | POA: Diagnosis not present

## 2020-07-17 DIAGNOSIS — I10 Essential (primary) hypertension: Secondary | ICD-10-CM | POA: Diagnosis not present

## 2020-07-17 DIAGNOSIS — Z23 Encounter for immunization: Secondary | ICD-10-CM | POA: Diagnosis not present

## 2020-07-17 DIAGNOSIS — Z0000 Encounter for general adult medical examination without abnormal findings: Secondary | ICD-10-CM | POA: Diagnosis not present

## 2020-07-17 DIAGNOSIS — Z1389 Encounter for screening for other disorder: Secondary | ICD-10-CM | POA: Diagnosis not present

## 2020-07-17 DIAGNOSIS — Z125 Encounter for screening for malignant neoplasm of prostate: Secondary | ICD-10-CM | POA: Diagnosis not present

## 2020-08-12 DIAGNOSIS — F325 Major depressive disorder, single episode, in full remission: Secondary | ICD-10-CM | POA: Diagnosis not present

## 2020-08-12 DIAGNOSIS — F321 Major depressive disorder, single episode, moderate: Secondary | ICD-10-CM | POA: Diagnosis not present

## 2020-08-12 DIAGNOSIS — N401 Enlarged prostate with lower urinary tract symptoms: Secondary | ICD-10-CM | POA: Diagnosis not present

## 2020-08-12 DIAGNOSIS — F324 Major depressive disorder, single episode, in partial remission: Secondary | ICD-10-CM | POA: Diagnosis not present

## 2020-08-12 DIAGNOSIS — E7800 Pure hypercholesterolemia, unspecified: Secondary | ICD-10-CM | POA: Diagnosis not present

## 2020-08-12 DIAGNOSIS — G4700 Insomnia, unspecified: Secondary | ICD-10-CM | POA: Diagnosis not present

## 2020-08-12 DIAGNOSIS — E1165 Type 2 diabetes mellitus with hyperglycemia: Secondary | ICD-10-CM | POA: Diagnosis not present

## 2020-08-12 DIAGNOSIS — E1169 Type 2 diabetes mellitus with other specified complication: Secondary | ICD-10-CM | POA: Diagnosis not present

## 2020-08-12 DIAGNOSIS — I10 Essential (primary) hypertension: Secondary | ICD-10-CM | POA: Diagnosis not present

## 2020-10-03 DIAGNOSIS — G4700 Insomnia, unspecified: Secondary | ICD-10-CM | POA: Diagnosis not present

## 2020-10-03 DIAGNOSIS — E7800 Pure hypercholesterolemia, unspecified: Secondary | ICD-10-CM | POA: Diagnosis not present

## 2020-10-03 DIAGNOSIS — N401 Enlarged prostate with lower urinary tract symptoms: Secondary | ICD-10-CM | POA: Diagnosis not present

## 2020-10-03 DIAGNOSIS — E1165 Type 2 diabetes mellitus with hyperglycemia: Secondary | ICD-10-CM | POA: Diagnosis not present

## 2020-10-03 DIAGNOSIS — E1169 Type 2 diabetes mellitus with other specified complication: Secondary | ICD-10-CM | POA: Diagnosis not present

## 2020-10-03 DIAGNOSIS — F325 Major depressive disorder, single episode, in full remission: Secondary | ICD-10-CM | POA: Diagnosis not present

## 2020-10-03 DIAGNOSIS — I10 Essential (primary) hypertension: Secondary | ICD-10-CM | POA: Diagnosis not present

## 2020-11-28 DIAGNOSIS — E1165 Type 2 diabetes mellitus with hyperglycemia: Secondary | ICD-10-CM | POA: Diagnosis not present

## 2020-11-28 DIAGNOSIS — G4700 Insomnia, unspecified: Secondary | ICD-10-CM | POA: Diagnosis not present

## 2020-11-28 DIAGNOSIS — F324 Major depressive disorder, single episode, in partial remission: Secondary | ICD-10-CM | POA: Diagnosis not present

## 2020-11-28 DIAGNOSIS — N401 Enlarged prostate with lower urinary tract symptoms: Secondary | ICD-10-CM | POA: Diagnosis not present

## 2020-11-28 DIAGNOSIS — E1169 Type 2 diabetes mellitus with other specified complication: Secondary | ICD-10-CM | POA: Diagnosis not present

## 2020-11-28 DIAGNOSIS — E7800 Pure hypercholesterolemia, unspecified: Secondary | ICD-10-CM | POA: Diagnosis not present

## 2020-11-28 DIAGNOSIS — I10 Essential (primary) hypertension: Secondary | ICD-10-CM | POA: Diagnosis not present

## 2020-12-09 DIAGNOSIS — R059 Cough, unspecified: Secondary | ICD-10-CM | POA: Diagnosis not present

## 2020-12-09 DIAGNOSIS — Z03818 Encounter for observation for suspected exposure to other biological agents ruled out: Secondary | ICD-10-CM | POA: Diagnosis not present

## 2020-12-09 DIAGNOSIS — J069 Acute upper respiratory infection, unspecified: Secondary | ICD-10-CM | POA: Diagnosis not present

## 2020-12-09 DIAGNOSIS — J029 Acute pharyngitis, unspecified: Secondary | ICD-10-CM | POA: Diagnosis not present

## 2021-01-27 DIAGNOSIS — I10 Essential (primary) hypertension: Secondary | ICD-10-CM | POA: Diagnosis not present

## 2021-01-27 DIAGNOSIS — R0789 Other chest pain: Secondary | ICD-10-CM | POA: Diagnosis not present

## 2021-01-27 DIAGNOSIS — E1169 Type 2 diabetes mellitus with other specified complication: Secondary | ICD-10-CM | POA: Diagnosis not present

## 2021-02-06 IMAGING — CT CT ANGIO CHEST
2 of 6 series · 17 of 46 positions shown · IV contrast (omnipaque)
Comparison: Radiograph same day

CLINICAL DATA: Hypoxemia

EXAM:
CT ANGIOGRAPHY CHEST WITH CONTRAST
TECHNIQUE: Multidetector CT imaging of the chest was performed using the
standard protocol during bolus administration of intravenous
contrast. Multiplanar CT image reconstructions and MIPs were
obtained to evaluate the vascular anatomy.
CONTRAST:  75mL OMNIPAQUE IOHEXOL 350 MG/ML SOLN

[Series 6: thins · axial · 0.86mm/px · z∈[+1285,+1504]mm · 14 of 241 slices shown]
[im 11/241  lung]
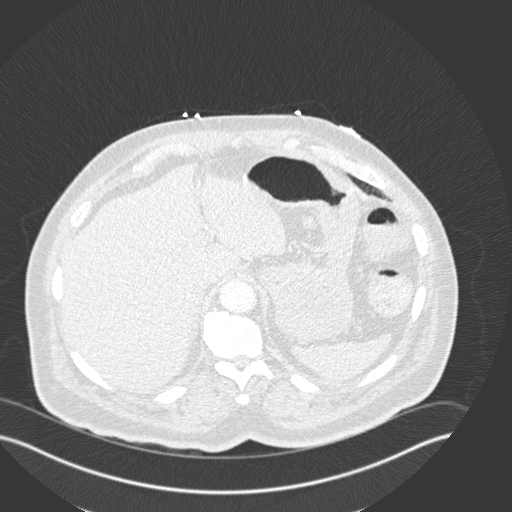
[im 32/241  soft-tissue]
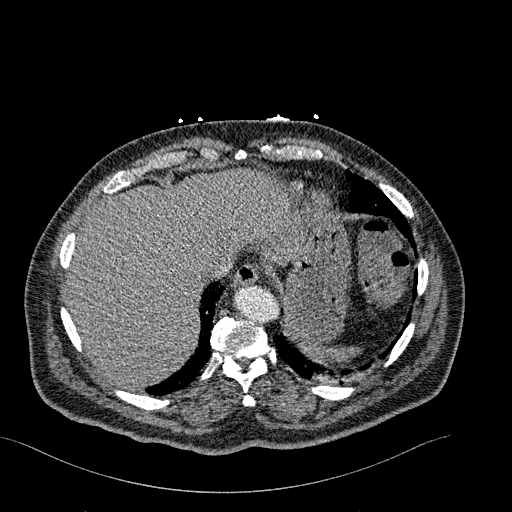
[im 42/241  lung]
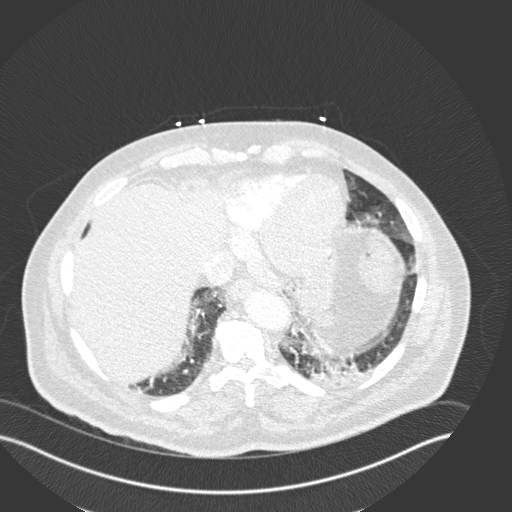
[im 63/241  soft-tissue]
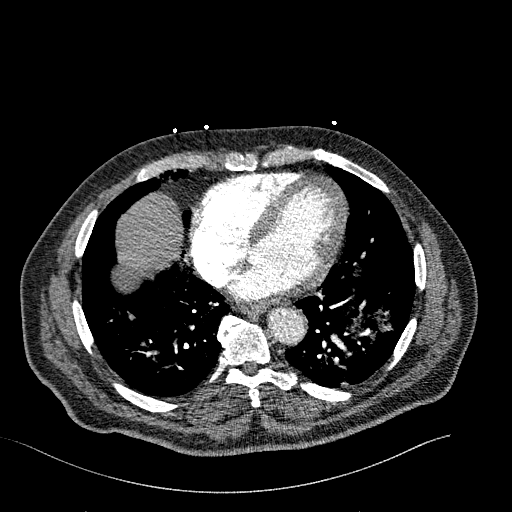
[im 84/241  lung]
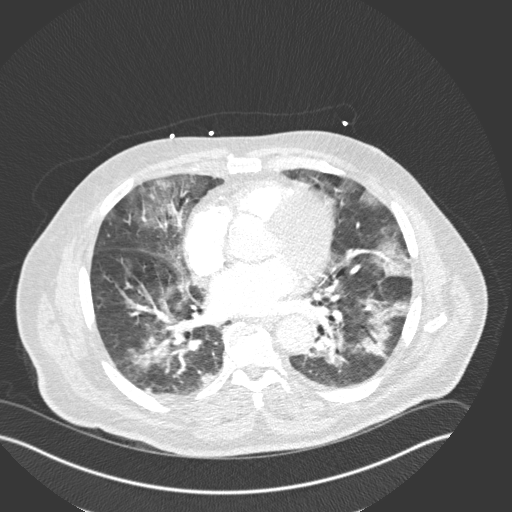
[im 94/241  soft-tissue]
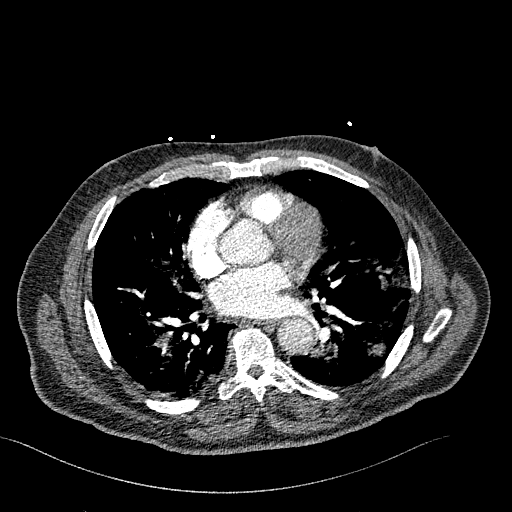
[im 115/241  lung]
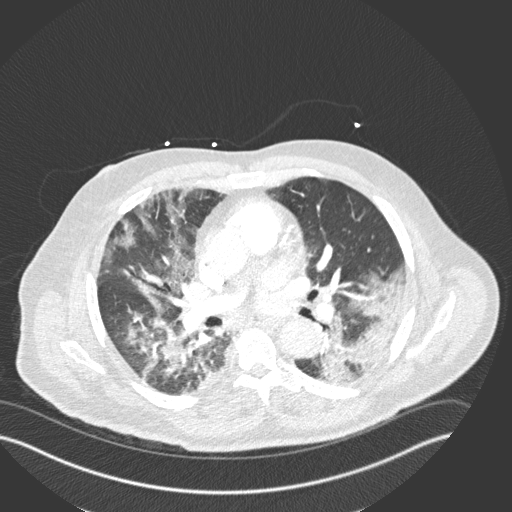
[im 126/241  soft-tissue]
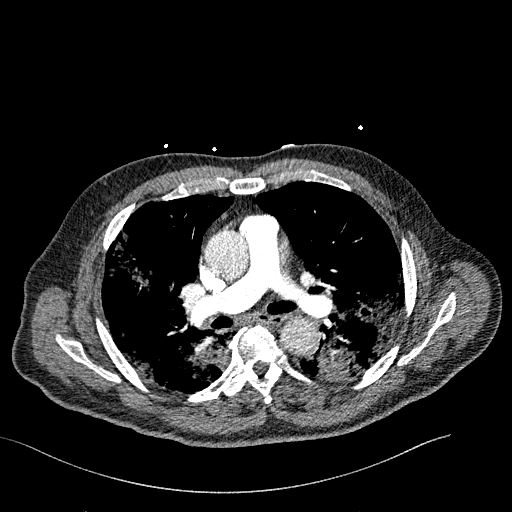
[im 147/241  lung]
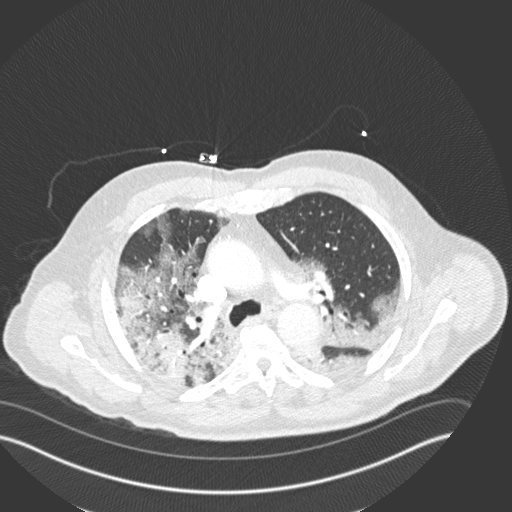
[im 157/241  soft-tissue]
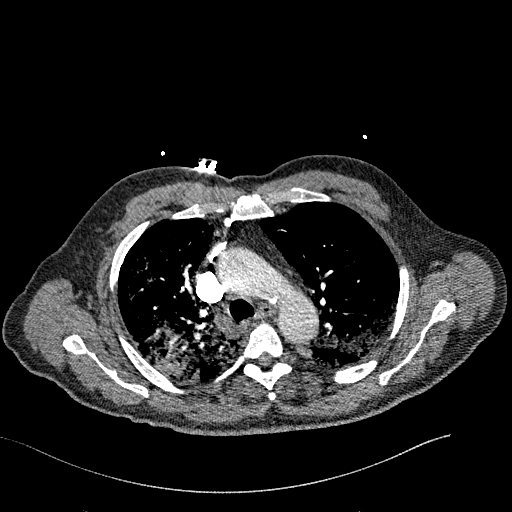
[im 178/241  lung]
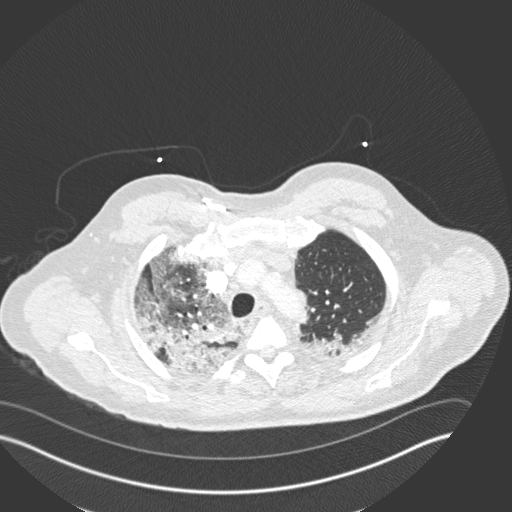
[im 199/241  soft-tissue]
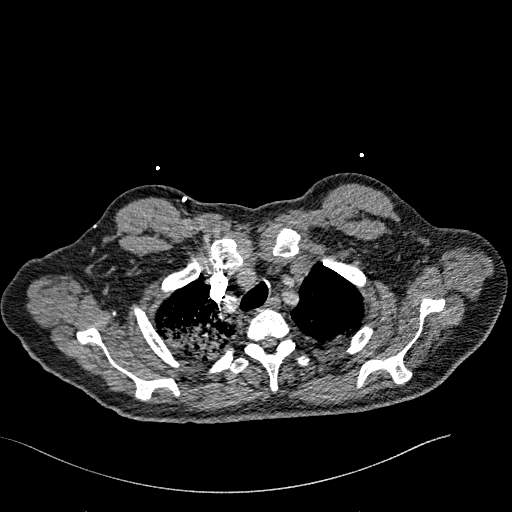
[im 209/241  lung]
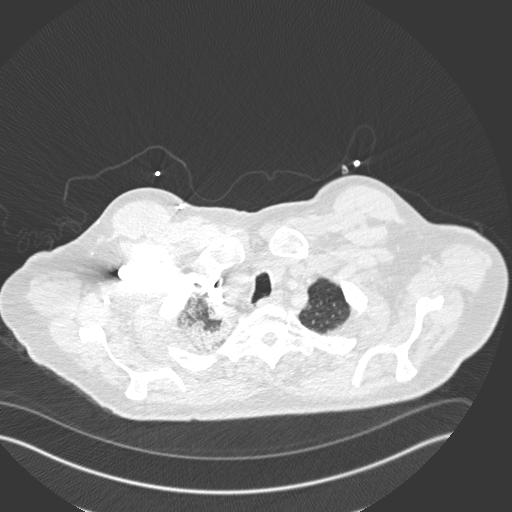
[im 230/241  soft-tissue]
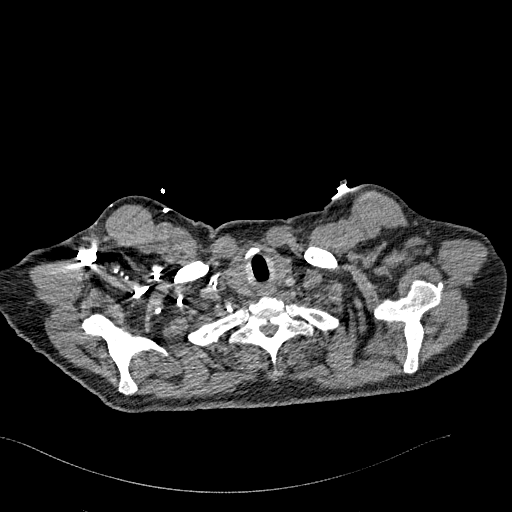

[Series 8: coronal mpr · coronal · 0.48mm/px · 3 of 136 slices shown]
[im 34/136  soft-tissue]
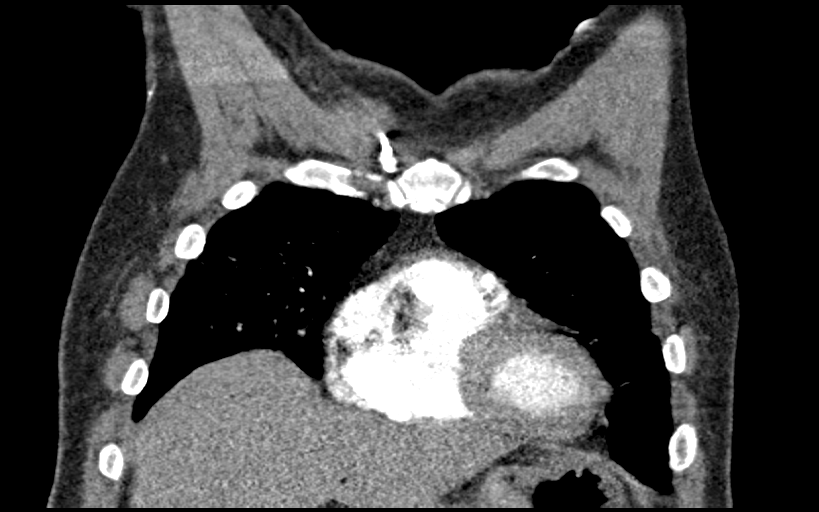
[im 68/136  soft-tissue]
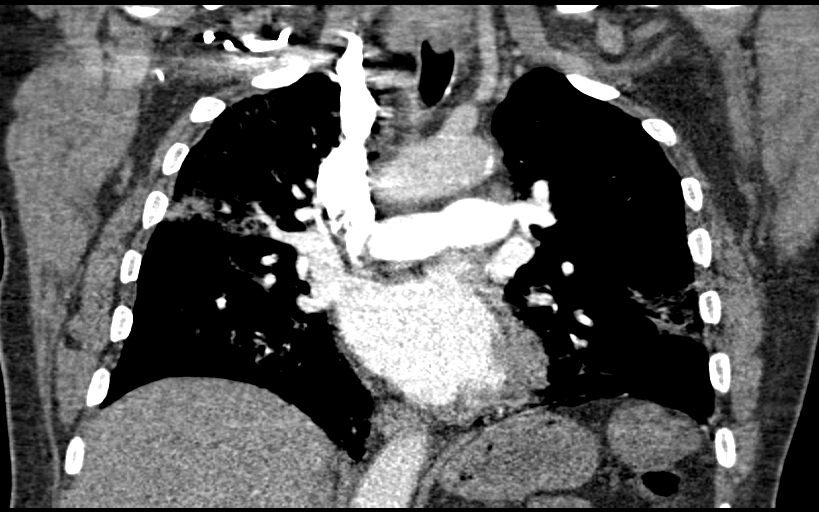
[im 102/136  soft-tissue]
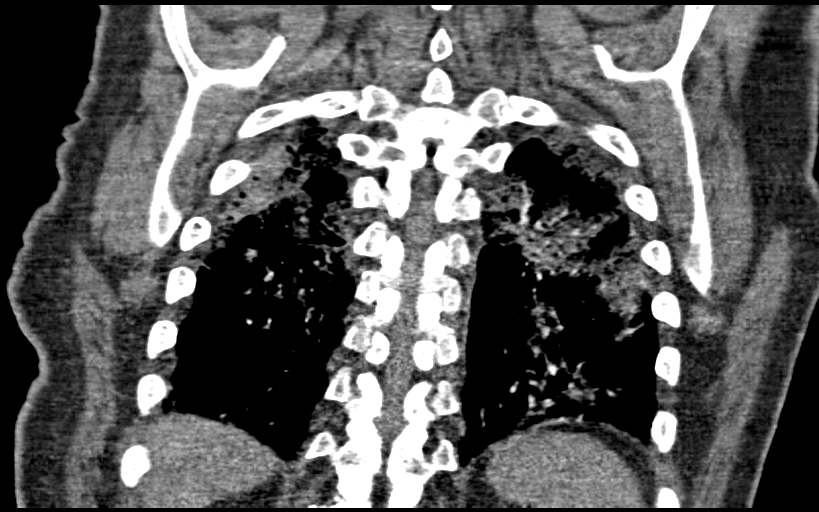

[17 of 46 positions shown; findings below may reference images not displayed]

FINDINGS: Cardiovascular: There is a optimal opacification of the pulmonary
arteries. There is no central,segmental, or subsegmental filling
defects within the pulmonary arteries. The heart is normal in size.
No pericardial effusion or thickening. No evidence right heart
strain. There is normal three-vessel brachiocephalic anatomy without
proximal stenosis. Scattered aortic atherosclerosis.

Mediastinum/Nodes: No hilar, mediastinal, or axillary adenopathy.
Thyroid gland, trachea, and esophagus demonstrate no significant
findings.

Lungs/Pleura: Multifocal patchy ground-glass hazy airspace
consolidation are seen throughout both lungs. No pneumothorax or
pleural effusion.

Upper Abdomen: No acute abnormalities present in the visualized
portions of the upper abdomen.

Musculoskeletal: No chest wall abnormality. No acute or significant
osseous findings.

Review of the MIP images confirms the above findings.
IMPRESSION: No central, segmental, or subsegmental pulmonary embolism.

Extensive multifocal airspace opacities throughout both lungs,
consistent with COVID pneumonia.

## 2021-02-28 DIAGNOSIS — Z20822 Contact with and (suspected) exposure to covid-19: Secondary | ICD-10-CM | POA: Diagnosis not present

## 2021-07-23 DIAGNOSIS — Z0000 Encounter for general adult medical examination without abnormal findings: Secondary | ICD-10-CM | POA: Diagnosis not present

## 2021-07-23 DIAGNOSIS — E1169 Type 2 diabetes mellitus with other specified complication: Secondary | ICD-10-CM | POA: Diagnosis not present

## 2021-07-23 DIAGNOSIS — G514 Facial myokymia: Secondary | ICD-10-CM | POA: Diagnosis not present

## 2021-07-23 DIAGNOSIS — M7712 Lateral epicondylitis, left elbow: Secondary | ICD-10-CM | POA: Diagnosis not present

## 2021-07-23 DIAGNOSIS — E7800 Pure hypercholesterolemia, unspecified: Secondary | ICD-10-CM | POA: Diagnosis not present

## 2021-07-23 DIAGNOSIS — Z1331 Encounter for screening for depression: Secondary | ICD-10-CM | POA: Diagnosis not present

## 2021-07-23 DIAGNOSIS — I10 Essential (primary) hypertension: Secondary | ICD-10-CM | POA: Diagnosis not present

## 2021-07-23 DIAGNOSIS — G4700 Insomnia, unspecified: Secondary | ICD-10-CM | POA: Diagnosis not present

## 2021-08-03 DIAGNOSIS — E1169 Type 2 diabetes mellitus with other specified complication: Secondary | ICD-10-CM | POA: Diagnosis not present

## 2021-08-03 DIAGNOSIS — G4700 Insomnia, unspecified: Secondary | ICD-10-CM | POA: Diagnosis not present

## 2021-08-03 DIAGNOSIS — N401 Enlarged prostate with lower urinary tract symptoms: Secondary | ICD-10-CM | POA: Diagnosis not present

## 2021-08-03 DIAGNOSIS — E7800 Pure hypercholesterolemia, unspecified: Secondary | ICD-10-CM | POA: Diagnosis not present

## 2021-08-03 DIAGNOSIS — I10 Essential (primary) hypertension: Secondary | ICD-10-CM | POA: Diagnosis not present

## 2021-08-03 DIAGNOSIS — F325 Major depressive disorder, single episode, in full remission: Secondary | ICD-10-CM | POA: Diagnosis not present

## 2021-11-03 DIAGNOSIS — E119 Type 2 diabetes mellitus without complications: Secondary | ICD-10-CM | POA: Diagnosis not present

## 2021-11-03 DIAGNOSIS — H2513 Age-related nuclear cataract, bilateral: Secondary | ICD-10-CM | POA: Diagnosis not present

## 2021-11-03 DIAGNOSIS — H524 Presbyopia: Secondary | ICD-10-CM | POA: Diagnosis not present

## 2021-11-12 DIAGNOSIS — S39012A Strain of muscle, fascia and tendon of lower back, initial encounter: Secondary | ICD-10-CM | POA: Diagnosis not present

## 2021-11-27 DIAGNOSIS — E669 Obesity, unspecified: Secondary | ICD-10-CM | POA: Diagnosis not present

## 2021-11-27 DIAGNOSIS — Z23 Encounter for immunization: Secondary | ICD-10-CM | POA: Diagnosis not present

## 2021-11-27 DIAGNOSIS — E7800 Pure hypercholesterolemia, unspecified: Secondary | ICD-10-CM | POA: Diagnosis not present

## 2021-11-27 DIAGNOSIS — E1169 Type 2 diabetes mellitus with other specified complication: Secondary | ICD-10-CM | POA: Diagnosis not present

## 2021-11-27 DIAGNOSIS — N401 Enlarged prostate with lower urinary tract symptoms: Secondary | ICD-10-CM | POA: Diagnosis not present

## 2021-11-27 DIAGNOSIS — Z794 Long term (current) use of insulin: Secondary | ICD-10-CM | POA: Diagnosis not present

## 2021-11-27 DIAGNOSIS — I10 Essential (primary) hypertension: Secondary | ICD-10-CM | POA: Diagnosis not present

## 2022-04-15 ENCOUNTER — Ambulatory Visit: Payer: Medicare PPO | Admitting: Podiatry

## 2022-04-15 DIAGNOSIS — E119 Type 2 diabetes mellitus without complications: Secondary | ICD-10-CM | POA: Diagnosis not present

## 2022-04-15 DIAGNOSIS — M79674 Pain in right toe(s): Secondary | ICD-10-CM | POA: Diagnosis not present

## 2022-04-15 DIAGNOSIS — Z794 Long term (current) use of insulin: Secondary | ICD-10-CM

## 2022-04-15 DIAGNOSIS — B351 Tinea unguium: Secondary | ICD-10-CM

## 2022-04-15 DIAGNOSIS — M79675 Pain in left toe(s): Secondary | ICD-10-CM | POA: Diagnosis not present

## 2022-04-15 NOTE — Progress Notes (Signed)
  Subjective:  Patient ID: CONSTANDINOS DEEDS, male    DOB: 09/25/1950,  MRN: [id number]  Chief Complaint  Patient presents with   Nail Problem    DFC,/ Evaluation Nail trim BS-127 A1C-6.8    72 y.o. male presents with the above complaint. History confirmed with patient. Patient presenting with pain related to dystrophic thickened elongated nails. Patient is unable to trim own nails related to nail dystrophy and/or mobility issues. Patient does  have a history of T2DM. Denies numbness or calluses.  Objective:  Physical Exam: warm, good capillary refill nail exam onychomycosis of the toenails, onycholysis, and dystrophic nails DP pulses palpable, PT pulses palpable, and protective sensation intact Left Foot:  Pain with palpation of nails due to elongation and dystrophic growth.  Right Foot: Pain with palpation of nails due to elongation and dystrophic growth.   Assessment:   1. Pain due to onychomycosis of toenails of both feet   2. Type 2 diabetes mellitus without complication, with long-term current use of insulin ([place])      Plan:  Patient was evaluated and treated and all questions answered.  #Dm2 without complication Patient educated on diabetes. Discussed proper diabetic foot care and discussed risks and complications of disease. Educated patient in depth on reasons to return to the office immediately should he/she discover anything concerning or new on the feet. All questions answered. Discussed proper shoes as well.    #Onychomycosis with pain  -Nails palliatively debrided as below. -Educated on self-care  Procedure: Nail Debridement Rationale: Pain Type of Debridement: manual, sharp debridement. Instrumentation: Nail nipper, rotary burr. Number of Nails: 10  Return in about 3 months (around 07/14/2022) for [facility].         Everitt Amber, DPM [facility]

## 2022-07-22 ENCOUNTER — Ambulatory Visit (INDEPENDENT_AMBULATORY_CARE_PROVIDER_SITE_OTHER): Payer: Medicare PPO | Admitting: Podiatry

## 2022-07-22 DIAGNOSIS — Z794 Long term (current) use of insulin: Secondary | ICD-10-CM

## 2022-07-22 DIAGNOSIS — B351 Tinea unguium: Secondary | ICD-10-CM | POA: Diagnosis not present

## 2022-07-22 DIAGNOSIS — M79675 Pain in left toe(s): Secondary | ICD-10-CM

## 2022-07-22 DIAGNOSIS — E119 Type 2 diabetes mellitus without complications: Secondary | ICD-10-CM

## 2022-07-22 DIAGNOSIS — M79674 Pain in right toe(s): Secondary | ICD-10-CM

## 2022-07-22 NOTE — Progress Notes (Signed)
  Subjective:  Patient ID: Randy Baker, male    DOB: [birth year],  MRN: [id number]  Chief Complaint  Patient presents with   Nail Problem    Rm 15 RFC bilateral nail trim    72 y.o. male presents with the above complaint. History confirmed with patient. Patient presenting with pain related to dystrophic thickened elongated nails. Patient is unable to trim own nails related to nail dystrophy and/or mobility issues. Patient does  have a history of T2DM. Denies numbness or calluses.  Objective:  Physical Exam: warm, good capillary refill nail exam onychomycosis of the toenails, onycholysis, and dystrophic nails DP pulses palpable, PT pulses palpable, and protective sensation intact Left Foot:  Pain with palpation of nails due to elongation and dystrophic growth.  Right Foot: Pain with palpation of nails due to elongation and dystrophic growth.   Assessment:   1. Pain due to onychomycosis of toenails of both feet   2. Type 2 diabetes mellitus without complication, with long-term current use of insulin ([facility])       Plan:  Patient was evaluated and treated and all questions answered.  #Onychomycosis with pain  -Nails palliatively debrided as below. -Educated on self-care  Procedure: Nail Debridement Rationale: Pain Type of Debridement: manual, sharp debridement. Instrumentation: Nail nipper, rotary burr. Number of Nails: 10  Return in about 3 months (around 10/22/2022) for [facility].         Corinna Gab, DPM [facility]

## 2022-08-12 DIAGNOSIS — N401 Enlarged prostate with lower urinary tract symptoms: Secondary | ICD-10-CM | POA: Diagnosis not present

## 2022-08-12 DIAGNOSIS — R972 Elevated prostate specific antigen [PSA]: Secondary | ICD-10-CM | POA: Diagnosis not present

## 2022-08-12 DIAGNOSIS — I10 Essential (primary) hypertension: Secondary | ICD-10-CM | POA: Diagnosis not present

## 2022-08-12 DIAGNOSIS — Z1331 Encounter for screening for depression: Secondary | ICD-10-CM | POA: Diagnosis not present

## 2022-08-12 DIAGNOSIS — E1169 Type 2 diabetes mellitus with other specified complication: Secondary | ICD-10-CM | POA: Diagnosis not present

## 2022-08-12 DIAGNOSIS — E119 Type 2 diabetes mellitus without complications: Secondary | ICD-10-CM | POA: Diagnosis not present

## 2022-08-12 DIAGNOSIS — F325 Major depressive disorder, single episode, in full remission: Secondary | ICD-10-CM | POA: Diagnosis not present

## 2022-08-12 DIAGNOSIS — E7800 Pure hypercholesterolemia, unspecified: Secondary | ICD-10-CM | POA: Diagnosis not present

## 2022-08-12 DIAGNOSIS — Z794 Long term (current) use of insulin: Secondary | ICD-10-CM | POA: Diagnosis not present

## 2022-08-12 DIAGNOSIS — Z0000 Encounter for general adult medical examination without abnormal findings: Secondary | ICD-10-CM | POA: Diagnosis not present

## 2022-09-23 ENCOUNTER — Ambulatory Visit (INDEPENDENT_AMBULATORY_CARE_PROVIDER_SITE_OTHER): Payer: Medicare PPO | Admitting: Podiatry

## 2022-09-23 DIAGNOSIS — M79674 Pain in right toe(s): Secondary | ICD-10-CM | POA: Diagnosis not present

## 2022-09-23 DIAGNOSIS — E119 Type 2 diabetes mellitus without complications: Secondary | ICD-10-CM

## 2022-09-23 DIAGNOSIS — Z794 Long term (current) use of insulin: Secondary | ICD-10-CM

## 2022-09-23 DIAGNOSIS — M79675 Pain in left toe(s): Secondary | ICD-10-CM | POA: Diagnosis not present

## 2022-09-23 DIAGNOSIS — B351 Tinea unguium: Secondary | ICD-10-CM

## 2022-09-23 NOTE — Progress Notes (Signed)
  Subjective:  Patient ID: Randy Baker, male    DOB: [birth year],  MRN: [id number]  Chief Complaint  Patient presents with   Nail Problem    Diabetic Foot Care- nail trim     72 y.o. male presents with the above complaint. History confirmed with patient. Patient presenting with pain related to dystrophic thickened elongated nails. Patient is unable to trim own nails related to nail dystrophy and/or mobility issues. Patient does  have a history of T2DM. Denies numbness or calluses.  Objective:  Physical Exam: warm, good capillary refill nail exam onychomycosis of the toenails, onycholysis, and dystrophic nails DP pulses palpable, PT pulses palpable, and protective sensation intact Left Foot:  Pain with palpation of nails due to elongation and dystrophic growth.  Right Foot: Pain with palpation of nails due to elongation and dystrophic growth.   Assessment:   1. Pain due to onychomycosis of toenails of both feet   2. Type 2 diabetes mellitus without complication, with long-term current use of insulin (HCC)        Plan:  Patient was evaluated and treated and all questions answered.  #Onychomycosis with pain  -Nails palliatively debrided as below. -Educated on self-care  Procedure: Nail Debridement Rationale: Pain Type of Debridement: manual, sharp debridement. Instrumentation: Nail nipper, rotary burr. Number of Nails: 10  Return in about 3 months (around 12/24/2022) for [facility].         Corinna Gab, DPM [facility] / [facility]

## 2022-10-26 DIAGNOSIS — E119 Type 2 diabetes mellitus without complications: Secondary | ICD-10-CM | POA: Diagnosis not present

## 2022-11-01 DIAGNOSIS — R351 Nocturia: Secondary | ICD-10-CM | POA: Diagnosis not present

## 2022-11-01 DIAGNOSIS — R972 Elevated prostate specific antigen [PSA]: Secondary | ICD-10-CM | POA: Diagnosis not present

## 2022-11-01 DIAGNOSIS — R350 Frequency of micturition: Secondary | ICD-10-CM | POA: Diagnosis not present

## 2022-11-01 DIAGNOSIS — N401 Enlarged prostate with lower urinary tract symptoms: Secondary | ICD-10-CM | POA: Diagnosis not present

## 2022-11-08 DIAGNOSIS — R972 Elevated prostate specific antigen [PSA]: Secondary | ICD-10-CM | POA: Diagnosis not present

## 2022-11-10 ENCOUNTER — Other Ambulatory Visit: Payer: Self-pay | Admitting: Urology

## 2022-11-10 DIAGNOSIS — R972 Elevated prostate specific antigen [PSA]: Secondary | ICD-10-CM

## 2022-12-19 ENCOUNTER — Ambulatory Visit
Admission: RE | Admit: 2022-12-19 | Discharge: 2022-12-19 | Disposition: A | Discharge status: Home / Self Care | Payer: Medicare PPO | Source: Ambulatory Visit | Attending: Urology | Admitting: Urology

## 2022-12-19 DIAGNOSIS — R972 Elevated prostate specific antigen [PSA]: Secondary | ICD-10-CM | POA: Diagnosis not present

## 2022-12-19 MED ORDER — GADOPICLENOL 0.5 MMOL/ML IV SOLN
10.0000 mL | Freq: Once | INTRAVENOUS | Status: AC | PRN
  Administered 2022-12-19: 10 mL via INTRAVENOUS

## 2022-12-23 ENCOUNTER — Ambulatory Visit (INDEPENDENT_AMBULATORY_CARE_PROVIDER_SITE_OTHER): Payer: Medicare PPO | Admitting: Podiatry

## 2022-12-23 ENCOUNTER — Encounter: Payer: Self-pay | Admitting: Podiatry

## 2022-12-23 DIAGNOSIS — M79675 Pain in left toe(s): Secondary | ICD-10-CM | POA: Diagnosis not present

## 2022-12-23 DIAGNOSIS — M79674 Pain in right toe(s): Secondary | ICD-10-CM | POA: Diagnosis not present

## 2022-12-23 DIAGNOSIS — B351 Tinea unguium: Secondary | ICD-10-CM

## 2022-12-23 DIAGNOSIS — Z794 Long term (current) use of insulin: Secondary | ICD-10-CM

## 2022-12-23 DIAGNOSIS — E119 Type 2 diabetes mellitus without complications: Secondary | ICD-10-CM

## 2022-12-23 NOTE — Progress Notes (Signed)
  Subjective:  Patient ID: Randy Baker, male    DOB: 25-Dec-1950,  MRN: [id number]  No chief complaint on file.   72 y.o. male presents with the above complaint. History confirmed with patient. Patient presenting with pain related to dystrophic thickened elongated nails. Patient is unable to trim own nails related to nail dystrophy and/or mobility issues. Patient does  have a history of T2DM. Denies numbness or calluses.  Objective:  Physical Exam: warm, good capillary refill nail exam onychomycosis of the toenails, onycholysis, and dystrophic nails DP pulses palpable, PT pulses palpable, and protective sensation intact Left Foot:  Pain with palpation of nails due to elongation and dystrophic growth.  Right Foot: Pain with palpation of nails due to elongation and dystrophic growth.   Assessment:   1. Pain due to onychomycosis of toenails of both feet   2. Type 2 diabetes mellitus without complication, with long-term current use of insulin (HCC)         Plan:  Patient was evaluated and treated and all questions answered.  #Onychomycosis with pain  -Nails palliatively debrided as below. -Educated on self-care  Procedure: Nail Debridement Rationale: Pain Type of Debridement: manual, sharp debridement. Instrumentation: Nail nipper, rotary burr. Number of Nails: 10  Return in about 3 months (around 03/25/2023) for [facility].         Corinna Gab, DPM [facility] / [facility]

## 2023-01-21 DIAGNOSIS — R972 Elevated prostate specific antigen [PSA]: Secondary | ICD-10-CM | POA: Diagnosis not present

## 2023-01-24 DIAGNOSIS — N401 Enlarged prostate with lower urinary tract symptoms: Secondary | ICD-10-CM | POA: Diagnosis not present

## 2023-01-24 DIAGNOSIS — R351 Nocturia: Secondary | ICD-10-CM | POA: Diagnosis not present

## 2023-01-24 DIAGNOSIS — R972 Elevated prostate specific antigen [PSA]: Secondary | ICD-10-CM | POA: Diagnosis not present

## 2023-01-24 DIAGNOSIS — R350 Frequency of micturition: Secondary | ICD-10-CM | POA: Diagnosis not present

## 2023-02-21 DIAGNOSIS — I10 Essential (primary) hypertension: Secondary | ICD-10-CM | POA: Diagnosis not present

## 2023-02-21 DIAGNOSIS — R972 Elevated prostate specific antigen [PSA]: Secondary | ICD-10-CM | POA: Diagnosis not present

## 2023-02-21 DIAGNOSIS — F325 Major depressive disorder, single episode, in full remission: Secondary | ICD-10-CM | POA: Diagnosis not present

## 2023-02-21 DIAGNOSIS — E7800 Pure hypercholesterolemia, unspecified: Secondary | ICD-10-CM | POA: Diagnosis not present

## 2023-02-21 DIAGNOSIS — E1169 Type 2 diabetes mellitus with other specified complication: Secondary | ICD-10-CM | POA: Diagnosis not present

## 2023-02-21 DIAGNOSIS — E669 Obesity, unspecified: Secondary | ICD-10-CM | POA: Diagnosis not present

## 2023-02-21 DIAGNOSIS — N401 Enlarged prostate with lower urinary tract symptoms: Secondary | ICD-10-CM | POA: Diagnosis not present

## 2023-03-31 ENCOUNTER — Encounter: Payer: Self-pay | Admitting: Podiatry

## 2023-03-31 ENCOUNTER — Ambulatory Visit (INDEPENDENT_AMBULATORY_CARE_PROVIDER_SITE_OTHER): Payer: Medicare PPO | Admitting: Podiatry

## 2023-03-31 VITALS — Ht 71.0 in | Wt 216.0 lb

## 2023-03-31 DIAGNOSIS — M79675 Pain in left toe(s): Secondary | ICD-10-CM

## 2023-03-31 DIAGNOSIS — E119 Type 2 diabetes mellitus without complications: Secondary | ICD-10-CM

## 2023-03-31 DIAGNOSIS — M79674 Pain in right toe(s): Secondary | ICD-10-CM | POA: Diagnosis not present

## 2023-03-31 DIAGNOSIS — Z794 Long term (current) use of insulin: Secondary | ICD-10-CM

## 2023-03-31 DIAGNOSIS — B351 Tinea unguium: Secondary | ICD-10-CM | POA: Diagnosis not present

## 2023-03-31 NOTE — Progress Notes (Signed)
  Subjective:  Patient ID: Randy Baker, male    DOB: 1950/11/16,  MRN: [id number]  Chief Complaint  Patient presents with   [facility]    He is here for diabetic nail trim and his last A1C was 7.1.     73 y.o. male presents with the above complaint. History confirmed with patient. Patient presenting with pain related to dystrophic thickened elongated nails. Patient is unable to trim own nails related to nail dystrophy and/or mobility issues. Patient does  have a history of T2DM. Denies numbness or calluses.  Objective:  Physical Exam: warm, good capillary refill nail exam onychomycosis of the toenails, onycholysis, and dystrophic nails DP pulses palpable, PT pulses palpable, and protective sensation intact Left Foot:  Pain with palpation of nails due to elongation and dystrophic growth.  Right Foot: Pain with palpation of nails due to elongation and dystrophic growth.   Assessment:   1. Pain due to onychomycosis of toenails of both feet   2. Type 2 diabetes mellitus without complication, with long-term current use of insulin (HCC)         Plan:  Patient was evaluated and treated and all questions answered.  #Onychomycosis with pain  -Nails palliatively debrided as below. -Educated on self-care  Procedure: Nail Debridement Rationale: Pain Type of Debridement: manual, sharp debridement. Instrumentation: Nail nipper, rotary burr. Number of Nails: 10  Return in about 3 months (around 06/28/2023) for Diabetic Foot [facility].         Barbaraann Share, DPM [facility] / [facility]

## 2023-06-30 ENCOUNTER — Encounter: Payer: Self-pay | Admitting: Podiatry

## 2023-06-30 ENCOUNTER — Ambulatory Visit: Payer: Medicare PPO | Admitting: Podiatry

## 2023-06-30 VITALS — Ht 71.0 in | Wt 216.0 lb

## 2023-06-30 DIAGNOSIS — Z794 Long term (current) use of insulin: Secondary | ICD-10-CM

## 2023-06-30 DIAGNOSIS — E119 Type 2 diabetes mellitus without complications: Secondary | ICD-10-CM

## 2023-06-30 DIAGNOSIS — B351 Tinea unguium: Secondary | ICD-10-CM | POA: Diagnosis not present

## 2023-06-30 DIAGNOSIS — M79675 Pain in left toe(s): Secondary | ICD-10-CM

## 2023-06-30 DIAGNOSIS — M79674 Pain in right toe(s): Secondary | ICD-10-CM | POA: Diagnosis not present

## 2023-06-30 NOTE — Progress Notes (Signed)
  Subjective:  Patient ID: Randy Baker, male    DOB: 12-30-1950,  MRN: [id number]  Chief Complaint  Patient presents with   Nail Problem    Pt is here for [facility].    73 y.o. male presents with the above complaint. History confirmed with patient. Patient presenting with pain related to dystrophic thickened elongated nails. Patient is unable to trim own nails related to nail dystrophy and/or mobility issues. Patient does  have a history of T2DM. Denies numbness or calluses.  Objective:  Physical Exam: warm, good capillary refill nail exam onychomycosis of the toenails, onycholysis, and dystrophic nails DP pulses palpable, PT pulses palpable, and protective sensation intact Left Foot:  Pain with palpation of nails due to elongation and dystrophic growth.  Right Foot: Pain with palpation of nails due to elongation and dystrophic growth.   Assessment:   1. Pain due to onychomycosis of toenails of both feet   2. Type 2 diabetes mellitus without complication, with long-term current use of insulin  [facility])         Plan:  Patient was evaluated and treated and all questions answered.  #Onychomycosis with pain  -Nails palliatively debrided as below. -Educated on self-care  Procedure: Nail Debridement Rationale: Pain Type of Debridement: manual, sharp debridement. Instrumentation: Nail nipper, rotary burr. Number of Nails: 10  Return in about 3 months (around 09/30/2023) for Diabetic Foot Care.         Reina Cara, DPM [facility] / [facility]

## 2023-07-03 ENCOUNTER — Encounter: Payer: Self-pay | Admitting: Podiatry

## 2023-07-25 DIAGNOSIS — X58XXXA Exposure to other specified factors, initial encounter: Secondary | ICD-10-CM | POA: Diagnosis not present

## 2023-07-25 DIAGNOSIS — S39012A Strain of muscle, fascia and tendon of lower back, initial encounter: Secondary | ICD-10-CM | POA: Diagnosis not present

## 2023-08-22 DIAGNOSIS — Z23 Encounter for immunization: Secondary | ICD-10-CM | POA: Diagnosis not present

## 2023-08-22 DIAGNOSIS — R972 Elevated prostate specific antigen [PSA]: Secondary | ICD-10-CM | POA: Diagnosis not present

## 2023-08-22 DIAGNOSIS — M549 Dorsalgia, unspecified: Secondary | ICD-10-CM | POA: Diagnosis not present

## 2023-08-22 DIAGNOSIS — E1140 Type 2 diabetes mellitus with diabetic neuropathy, unspecified: Secondary | ICD-10-CM | POA: Diagnosis not present

## 2023-08-22 DIAGNOSIS — Z0000 Encounter for general adult medical examination without abnormal findings: Secondary | ICD-10-CM | POA: Diagnosis not present

## 2023-08-22 DIAGNOSIS — Z1331 Encounter for screening for depression: Secondary | ICD-10-CM | POA: Diagnosis not present

## 2023-08-22 DIAGNOSIS — F325 Major depressive disorder, single episode, in full remission: Secondary | ICD-10-CM | POA: Diagnosis not present

## 2023-08-22 DIAGNOSIS — E7800 Pure hypercholesterolemia, unspecified: Secondary | ICD-10-CM | POA: Diagnosis not present

## 2023-08-22 DIAGNOSIS — I10 Essential (primary) hypertension: Secondary | ICD-10-CM | POA: Diagnosis not present

## 2023-08-22 DIAGNOSIS — N401 Enlarged prostate with lower urinary tract symptoms: Secondary | ICD-10-CM | POA: Diagnosis not present

## 2023-09-30 ENCOUNTER — Encounter: Payer: Self-pay | Admitting: Podiatry

## 2023-09-30 ENCOUNTER — Ambulatory Visit (INDEPENDENT_AMBULATORY_CARE_PROVIDER_SITE_OTHER): Admitting: Podiatry

## 2023-09-30 DIAGNOSIS — Z794 Long term (current) use of insulin: Secondary | ICD-10-CM

## 2023-09-30 DIAGNOSIS — M79674 Pain in right toe(s): Secondary | ICD-10-CM

## 2023-09-30 DIAGNOSIS — M79675 Pain in left toe(s): Secondary | ICD-10-CM | POA: Diagnosis not present

## 2023-09-30 DIAGNOSIS — B351 Tinea unguium: Secondary | ICD-10-CM

## 2023-09-30 DIAGNOSIS — E119 Type 2 diabetes mellitus without complications: Secondary | ICD-10-CM | POA: Diagnosis not present

## 2023-09-30 NOTE — Progress Notes (Unsigned)
  Subjective:  Patient ID: Randy Baker, male    DOB: [birth year],  MRN: [id number]  Chief Complaint  Patient presents with   Diabetes    DFC 3 mo.  Nail trim.  No calluses. No anti coag    73 y.o. male presents with the above complaint. History confirmed with patient. Patient presenting with pain related to dystrophic thickened elongated nails. Patient is unable to trim own nails related to nail dystrophy and/or mobility issues. Patient does  have a history of T2DM. No painful callus. Does report some painful paresthesias intermittently.  Objective:  Physical Exam: warm, good capillary refill nail exam onychomycosis of the toenails, onycholysis, and dystrophic nails DP pulses palpable, PT pulses palpable, and protective sensation intact, reporting subjective paresthesias. Left Foot:  Pain with palpation of nails due to elongation and dystrophic growth.  Right Foot: Pain with palpation of nails due to elongation and dystrophic growth.   Assessment:   1. Pain due to onychomycosis of toenails of both feet   2. Type 2 diabetes mellitus without complication, with long-term current use of insulin  [facility])         Plan:  Patient was evaluated and treated and all questions answered.  #Onychomycosis with pain  -Nails palliatively debrided as below. -Educated on self-care  Procedure: Nail Debridement Rationale: Pain Type of Debridement: manual, sharp debridement. Instrumentation: Nail nipper, rotary burr. Number of Nails: 10  Patient educated on diabetes. Discussed proper diabetic foot care and discussed risks and complications of disease. Educated patient in depth on reasons to return to the office immediately should he/she discover anything concerning or new on the feet. All questions answered. Discussed proper shoes as well.    Return in about 3 months (around 12/31/2023) for Diabetic Foot Care.         Ethan LITTIE Saddler, DPM [facility] / [facility]

## 2023-11-22 DIAGNOSIS — R972 Elevated prostate specific antigen [PSA]: Secondary | ICD-10-CM | POA: Diagnosis not present

## 2023-11-22 DIAGNOSIS — I10 Essential (primary) hypertension: Secondary | ICD-10-CM | POA: Diagnosis not present

## 2023-11-22 DIAGNOSIS — E1140 Type 2 diabetes mellitus with diabetic neuropathy, unspecified: Secondary | ICD-10-CM | POA: Diagnosis not present

## 2023-11-22 DIAGNOSIS — E7800 Pure hypercholesterolemia, unspecified: Secondary | ICD-10-CM | POA: Diagnosis not present

## 2023-11-22 DIAGNOSIS — Z23 Encounter for immunization: Secondary | ICD-10-CM | POA: Diagnosis not present

## 2024-01-05 ENCOUNTER — Encounter: Payer: Self-pay | Admitting: Podiatry

## 2024-01-05 ENCOUNTER — Ambulatory Visit (INDEPENDENT_AMBULATORY_CARE_PROVIDER_SITE_OTHER): Admitting: Podiatry

## 2024-01-05 DIAGNOSIS — M79674 Pain in right toe(s): Secondary | ICD-10-CM | POA: Diagnosis not present

## 2024-01-05 DIAGNOSIS — Z794 Long term (current) use of insulin: Secondary | ICD-10-CM

## 2024-01-05 DIAGNOSIS — E119 Type 2 diabetes mellitus without complications: Secondary | ICD-10-CM | POA: Diagnosis not present

## 2024-01-05 DIAGNOSIS — B351 Tinea unguium: Secondary | ICD-10-CM | POA: Diagnosis not present

## 2024-01-05 DIAGNOSIS — M79675 Pain in left toe(s): Secondary | ICD-10-CM | POA: Diagnosis not present

## 2024-01-05 NOTE — Progress Notes (Signed)
  Subjective:  Patient ID: Randy Baker, male    DOB: 09-28-1950,  MRN: [id number]  Chief Complaint  Patient presents with   [facility]    [facility]  no anti coag    73 y.o. male presents with the above complaint. History confirmed with patient. Patient presenting with pain related to dystrophic thickened elongated nails. Patient is unable to trim own nails related to nail dystrophy and/or mobility issues. Patient does  have a history of T2DM.  Last A1c 6.8.  No painful callus. Does report some painful paresthesias intermittently.  Objective:  Physical Exam: warm, good capillary refill nail exam onychomycosis of the toenails, onycholysis, and dystrophic nails DP pulses palpable, PT pulses palpable, and protective sensation intact, reporting subjective paresthesias. Left Foot:  Pain with palpation of nails due to elongation and dystrophic growth.  Right Foot: Pain with palpation of nails due to elongation and dystrophic growth.   Assessment:   1. Pain due to onychomycosis of toenails of both feet   2. Type 2 diabetes mellitus without complication, with long-term current use of insulin  (HCC)         Plan:  Patient was evaluated and treated and all questions answered.  #Onychomycosis with pain  -Nails palliatively debrided as below. -Educated on self-care  Procedure: Nail Debridement Rationale: Pain Type of Debridement: manual, sharp debridement. Instrumentation: Nail nipper, rotary burr. Number of Nails: 10   Patient educated on diabetes. Discussed proper diabetic foot care and discussed risks and complications of disease. Educated patient in depth on reasons to return to the office immediately should he/she discover anything concerning or new on the feet. All questions answered. Discussed proper shoes as well.   Return in about 3 months (around 04/06/2024) for Diabetic Foot Care.         Ethan LITTIE Saddler, DPM [facility] / [facility]

## 2024-04-05 ENCOUNTER — Ambulatory Visit: Admitting: Podiatry
